# Patient Record
Sex: Female | Born: 1935 | Race: White | Hispanic: No | Marital: Married | Smoking: Never smoker
Health system: Southern US, Community
[De-identification: ages and names within clinical notes are randomized; demographics above are authoritative.]

## PROBLEM LIST (undated history)

## (undated) DIAGNOSIS — R51 Headache: Secondary | ICD-10-CM

## (undated) DIAGNOSIS — J40 Bronchitis, not specified as acute or chronic: Secondary | ICD-10-CM

## (undated) DIAGNOSIS — E039 Hypothyroidism, unspecified: Secondary | ICD-10-CM

## (undated) DIAGNOSIS — S329XXA Fracture of unspecified parts of lumbosacral spine and pelvis, initial encounter for closed fracture: Secondary | ICD-10-CM

## (undated) DIAGNOSIS — I10 Essential (primary) hypertension: Secondary | ICD-10-CM

## (undated) DIAGNOSIS — G8929 Other chronic pain: Secondary | ICD-10-CM

## (undated) DIAGNOSIS — M549 Dorsalgia, unspecified: Secondary | ICD-10-CM

## (undated) DIAGNOSIS — I38 Endocarditis, valve unspecified: Secondary | ICD-10-CM

## (undated) DIAGNOSIS — F32A Depression, unspecified: Secondary | ICD-10-CM

## (undated) DIAGNOSIS — N289 Disorder of kidney and ureter, unspecified: Secondary | ICD-10-CM

## (undated) DIAGNOSIS — I509 Heart failure, unspecified: Secondary | ICD-10-CM

## (undated) DIAGNOSIS — F329 Major depressive disorder, single episode, unspecified: Secondary | ICD-10-CM

## (undated) HISTORY — PX: TONSILLECTOMY: SUR1361

## (undated) HISTORY — PX: ABDOMINAL HYSTERECTOMY: SHX81

## (undated) HISTORY — PX: HERNIA REPAIR: SHX51

---

## 1999-02-11 ENCOUNTER — Inpatient Hospital Stay (HOSPITAL_COMMUNITY): Admission: EM | Admit: 1999-02-11 | Discharge: 1999-02-13 | Payer: Self-pay | Admitting: Emergency Medicine

## 1999-02-11 ENCOUNTER — Encounter: Payer: Self-pay | Admitting: Interventional Cardiology

## 1999-03-30 ENCOUNTER — Encounter: Payer: Self-pay | Admitting: Family Medicine

## 1999-03-30 ENCOUNTER — Encounter: Admission: RE | Admit: 1999-03-30 | Discharge: 1999-03-30 | Payer: Self-pay | Admitting: Family Medicine

## 2000-03-20 ENCOUNTER — Other Ambulatory Visit: Admission: RE | Admit: 2000-03-20 | Discharge: 2000-03-20 | Payer: Self-pay | Admitting: Family Medicine

## 2000-03-31 ENCOUNTER — Encounter: Payer: Self-pay | Admitting: Family Medicine

## 2000-03-31 ENCOUNTER — Encounter: Admission: RE | Admit: 2000-03-31 | Discharge: 2000-03-31 | Payer: Self-pay | Admitting: Family Medicine

## 2000-05-03 ENCOUNTER — Encounter (INDEPENDENT_AMBULATORY_CARE_PROVIDER_SITE_OTHER): Payer: Self-pay | Admitting: Specialist

## 2000-05-03 ENCOUNTER — Ambulatory Visit (HOSPITAL_COMMUNITY): Admission: RE | Admit: 2000-05-03 | Discharge: 2000-05-03 | Payer: Self-pay | Admitting: Gastroenterology

## 2000-06-23 ENCOUNTER — Encounter: Payer: Self-pay | Admitting: Surgery

## 2000-06-23 ENCOUNTER — Ambulatory Visit (HOSPITAL_COMMUNITY): Admission: RE | Admit: 2000-06-23 | Discharge: 2000-06-23 | Payer: Self-pay | Admitting: Surgery

## 2000-07-04 ENCOUNTER — Encounter: Payer: Self-pay | Admitting: Surgery

## 2000-07-11 ENCOUNTER — Encounter: Payer: Self-pay | Admitting: Surgery

## 2000-07-13 ENCOUNTER — Inpatient Hospital Stay (HOSPITAL_COMMUNITY): Admission: EM | Admit: 2000-07-13 | Discharge: 2000-07-14 | Payer: Self-pay | Admitting: Surgery

## 2000-07-13 ENCOUNTER — Encounter: Payer: Self-pay | Admitting: Interventional Cardiology

## 2001-04-03 ENCOUNTER — Encounter: Admission: RE | Admit: 2001-04-03 | Discharge: 2001-04-03 | Payer: Self-pay | Admitting: Family Medicine

## 2001-04-03 ENCOUNTER — Encounter: Payer: Self-pay | Admitting: Family Medicine

## 2001-06-28 ENCOUNTER — Encounter: Admission: RE | Admit: 2001-06-28 | Discharge: 2001-06-28 | Payer: Self-pay | Admitting: Otolaryngology

## 2001-06-28 ENCOUNTER — Encounter: Payer: Self-pay | Admitting: Otolaryngology

## 2002-04-04 ENCOUNTER — Encounter: Payer: Self-pay | Admitting: Family Medicine

## 2002-04-04 ENCOUNTER — Encounter: Admission: RE | Admit: 2002-04-04 | Discharge: 2002-04-04 | Payer: Self-pay | Admitting: Family Medicine

## 2003-04-08 ENCOUNTER — Ambulatory Visit (HOSPITAL_COMMUNITY): Admission: RE | Admit: 2003-04-08 | Discharge: 2003-04-08 | Payer: Self-pay | Admitting: Sports Medicine

## 2003-04-15 ENCOUNTER — Other Ambulatory Visit: Admission: RE | Admit: 2003-04-15 | Discharge: 2003-04-15 | Payer: Self-pay | Admitting: Family Medicine

## 2003-04-23 ENCOUNTER — Encounter: Admission: RE | Admit: 2003-04-23 | Discharge: 2003-04-23 | Payer: Self-pay | Admitting: Family Medicine

## 2003-07-03 ENCOUNTER — Encounter: Admission: RE | Admit: 2003-07-03 | Discharge: 2003-07-03 | Payer: Self-pay | Admitting: Interventional Cardiology

## 2003-07-04 ENCOUNTER — Inpatient Hospital Stay (HOSPITAL_BASED_OUTPATIENT_CLINIC_OR_DEPARTMENT_OTHER): Admission: RE | Admit: 2003-07-04 | Discharge: 2003-07-04 | Payer: Self-pay | Admitting: Interventional Cardiology

## 2004-07-20 ENCOUNTER — Ambulatory Visit (HOSPITAL_COMMUNITY): Admission: RE | Admit: 2004-07-20 | Discharge: 2004-07-20 | Payer: Self-pay | Admitting: Family Medicine

## 2004-07-26 ENCOUNTER — Ambulatory Visit (HOSPITAL_COMMUNITY): Admission: RE | Admit: 2004-07-26 | Discharge: 2004-07-26 | Payer: Self-pay | Admitting: Interventional Cardiology

## 2004-07-26 ENCOUNTER — Encounter: Payer: Self-pay | Admitting: Internal Medicine

## 2004-09-15 ENCOUNTER — Ambulatory Visit: Payer: Self-pay | Admitting: Pulmonary Disease

## 2004-09-21 ENCOUNTER — Ambulatory Visit: Payer: Self-pay | Admitting: Pulmonary Disease

## 2004-09-21 ENCOUNTER — Ambulatory Visit: Admission: RE | Admit: 2004-09-21 | Discharge: 2004-09-21 | Payer: Self-pay | Admitting: Pulmonary Disease

## 2004-10-14 ENCOUNTER — Ambulatory Visit: Payer: Self-pay | Admitting: Pulmonary Disease

## 2005-08-09 ENCOUNTER — Ambulatory Visit (HOSPITAL_COMMUNITY): Admission: RE | Admit: 2005-08-09 | Discharge: 2005-08-09 | Payer: Self-pay | Admitting: Family Medicine

## 2005-12-29 ENCOUNTER — Encounter (INDEPENDENT_AMBULATORY_CARE_PROVIDER_SITE_OTHER): Payer: Self-pay | Admitting: Interventional Cardiology

## 2005-12-29 ENCOUNTER — Inpatient Hospital Stay (HOSPITAL_COMMUNITY): Admission: EM | Admit: 2005-12-29 | Discharge: 2005-12-30 | Payer: Self-pay | Admitting: Emergency Medicine

## 2006-06-08 ENCOUNTER — Other Ambulatory Visit: Admission: RE | Admit: 2006-06-08 | Discharge: 2006-06-08 | Payer: Self-pay | Admitting: Family Medicine

## 2006-07-26 ENCOUNTER — Encounter: Admission: RE | Admit: 2006-07-26 | Discharge: 2006-08-23 | Payer: Self-pay | Admitting: Family Medicine

## 2006-08-11 ENCOUNTER — Ambulatory Visit (HOSPITAL_COMMUNITY): Admission: RE | Admit: 2006-08-11 | Discharge: 2006-08-11 | Payer: Self-pay | Admitting: Family Medicine

## 2007-01-10 ENCOUNTER — Encounter
Admission: RE | Admit: 2007-01-10 | Discharge: 2007-01-10 | Payer: Self-pay | Admitting: Physical Medicine and Rehabilitation

## 2007-08-14 ENCOUNTER — Ambulatory Visit: Payer: Self-pay | Admitting: Internal Medicine

## 2007-08-14 ENCOUNTER — Ambulatory Visit: Payer: Self-pay | Admitting: Pulmonary Disease

## 2007-08-14 DIAGNOSIS — R0602 Shortness of breath: Secondary | ICD-10-CM

## 2007-08-20 ENCOUNTER — Telehealth (INDEPENDENT_AMBULATORY_CARE_PROVIDER_SITE_OTHER): Payer: Self-pay | Admitting: *Deleted

## 2007-08-30 ENCOUNTER — Telehealth (INDEPENDENT_AMBULATORY_CARE_PROVIDER_SITE_OTHER): Payer: Self-pay | Admitting: *Deleted

## 2007-09-08 ENCOUNTER — Encounter: Payer: Self-pay | Admitting: Pulmonary Disease

## 2007-09-08 DIAGNOSIS — J309 Allergic rhinitis, unspecified: Secondary | ICD-10-CM | POA: Insufficient documentation

## 2007-09-08 DIAGNOSIS — Z8679 Personal history of other diseases of the circulatory system: Secondary | ICD-10-CM | POA: Insufficient documentation

## 2007-09-08 DIAGNOSIS — I359 Nonrheumatic aortic valve disorder, unspecified: Secondary | ICD-10-CM

## 2007-09-08 DIAGNOSIS — R51 Headache: Secondary | ICD-10-CM | POA: Insufficient documentation

## 2007-09-08 DIAGNOSIS — H9190 Unspecified hearing loss, unspecified ear: Secondary | ICD-10-CM | POA: Insufficient documentation

## 2007-09-08 DIAGNOSIS — I10 Essential (primary) hypertension: Secondary | ICD-10-CM

## 2007-09-08 DIAGNOSIS — H3530 Unspecified macular degeneration: Secondary | ICD-10-CM | POA: Insufficient documentation

## 2007-09-08 DIAGNOSIS — R519 Headache, unspecified: Secondary | ICD-10-CM | POA: Insufficient documentation

## 2007-09-13 ENCOUNTER — Ambulatory Visit: Payer: Self-pay | Admitting: Internal Medicine

## 2007-09-13 DIAGNOSIS — J40 Bronchitis, not specified as acute or chronic: Secondary | ICD-10-CM

## 2007-10-17 ENCOUNTER — Ambulatory Visit (HOSPITAL_COMMUNITY): Admission: RE | Admit: 2007-10-17 | Discharge: 2007-10-17 | Payer: Self-pay | Admitting: Family Medicine

## 2007-11-27 ENCOUNTER — Ambulatory Visit (HOSPITAL_COMMUNITY): Admission: RE | Admit: 2007-11-27 | Discharge: 2007-11-27 | Payer: Self-pay | Admitting: Family Medicine

## 2007-12-06 ENCOUNTER — Ambulatory Visit: Payer: Self-pay | Admitting: Pulmonary Disease

## 2007-12-20 ENCOUNTER — Ambulatory Visit: Payer: Self-pay | Admitting: Internal Medicine

## 2008-01-07 ENCOUNTER — Ambulatory Visit: Payer: Self-pay | Admitting: Internal Medicine

## 2009-01-13 ENCOUNTER — Ambulatory Visit (HOSPITAL_COMMUNITY): Admission: RE | Admit: 2009-01-13 | Discharge: 2009-01-13 | Payer: Self-pay | Admitting: Family Medicine

## 2009-07-17 ENCOUNTER — Emergency Department (HOSPITAL_COMMUNITY): Admission: EM | Admit: 2009-07-17 | Discharge: 2009-07-17 | Payer: Self-pay | Admitting: Emergency Medicine

## 2009-08-07 ENCOUNTER — Ambulatory Visit (HOSPITAL_COMMUNITY): Admission: RE | Admit: 2009-08-07 | Discharge: 2009-08-07 | Payer: Self-pay | Admitting: Family Medicine

## 2010-04-20 ENCOUNTER — Emergency Department (HOSPITAL_COMMUNITY)
Admission: EM | Admit: 2010-04-20 | Discharge: 2010-04-20 | Payer: Self-pay | Source: Home / Self Care | Admitting: Emergency Medicine

## 2010-06-07 ENCOUNTER — Encounter: Payer: Self-pay | Admitting: Family Medicine

## 2010-07-26 LAB — POCT CARDIAC MARKERS
CKMB, poc: 1 ng/mL (ref 1.0–8.0)
CKMB, poc: <1 ng/mL — ABNORMAL LOW (ref 1.0–8.0)
Myoglobin, poc: 132 ng/mL (ref 12–200)
Myoglobin, poc: 161 ng/mL (ref 12–200)
Troponin i, poc: <0.05 ng/mL (ref 0.00–0.09)
Troponin i, poc: <0.05 ng/mL (ref 0.00–0.09)

## 2010-07-26 LAB — URINE MICROSCOPIC-ADD ON

## 2010-07-26 LAB — DIFFERENTIAL
Basophils Absolute: 0 10*3/uL (ref 0.0–0.1)
Basophils Relative: 0 % (ref 0–1)
Eosinophils Absolute: 0.1 K/uL (ref 0.0–0.7)
Eosinophils Relative: 2 % (ref 0–5)
Lymphocytes Relative: 22 % (ref 12–46)
Lymphs Abs: 1.5 K/uL (ref 0.7–4.0)
Monocytes Absolute: 0.3 10*3/uL (ref 0.1–1.0)
Monocytes Relative: 4 % (ref 3–12)
Neutro Abs: 5 10*3/uL (ref 1.7–7.7)
Neutrophils Relative %: 72 % (ref 43–77)

## 2010-07-26 LAB — URINALYSIS, ROUTINE W REFLEX MICROSCOPIC
Bilirubin Urine: NEGATIVE
Glucose, UA: NEGATIVE mg/dL
Hgb urine dipstick: NEGATIVE
Ketones, ur: NEGATIVE mg/dL
Nitrite: NEGATIVE
Protein, ur: NEGATIVE mg/dL
Specific Gravity, Urine: 1.012 (ref 1.005–1.030)
Urobilinogen, UA: 0.2 mg/dL (ref 0.0–1.0)
pH: 7 (ref 5.0–8.0)

## 2010-07-26 LAB — CBC
HCT: 34.3 % — ABNORMAL LOW (ref 36.0–46.0)
Hemoglobin: 11.1 g/dL — ABNORMAL LOW (ref 12.0–15.0)
MCH: 28.5 pg (ref 26.0–34.0)
MCHC: 32.4 g/dL (ref 30.0–36.0)
MCV: 88.2 fL (ref 78.0–100.0)
Platelets: 270 10*3/uL (ref 150–400)
RBC: 3.89 MIL/uL (ref 3.87–5.11)
RDW: 12.8 % (ref 11.5–15.5)
WBC: 6.9 K/uL (ref 4.0–10.5)

## 2010-07-26 LAB — URINE CULTURE
Colony Count: NO GROWTH
Culture  Setup Time: 201112061250
Culture: NO GROWTH

## 2010-07-26 LAB — POCT I-STAT, CHEM 8
BUN: 31 mg/dL — ABNORMAL HIGH (ref 6–23)
Calcium, Ion: 1.09 mmol/L — ABNORMAL LOW (ref 1.12–1.32)
Chloride: 103 mEq/L (ref 96–112)
Creatinine, Ser: 1.6 mg/dL — ABNORMAL HIGH (ref 0.4–1.2)
Glucose, Bld: 125 mg/dL — ABNORMAL HIGH (ref 70–99)
HCT: 34 % — ABNORMAL LOW (ref 36.0–46.0)
Hemoglobin: 11.6 g/dL — ABNORMAL LOW (ref 12.0–15.0)
Potassium: 4 mEq/L (ref 3.5–5.1)
Sodium: 137 meq/L (ref 135–145)
TCO2: 29 mmol/L (ref 0–100)

## 2010-07-26 LAB — BRAIN NATRIURETIC PEPTIDE: Pro B Natriuretic peptide (BNP): 154 pg/mL — ABNORMAL HIGH (ref 0.0–100.0)

## 2010-07-26 LAB — HEPATIC FUNCTION PANEL
ALT: 27 U/L (ref 0–35)
AST: 32 U/L (ref 0–37)
Albumin: 3.5 g/dL (ref 3.5–5.2)
Alkaline Phosphatase: 116 U/L (ref 39–117)
Bilirubin, Direct: 0.1 mg/dL (ref 0.0–0.3)
Indirect Bilirubin: 0.7 mg/dL (ref 0.3–0.9)
Total Bilirubin: 0.8 mg/dL (ref 0.3–1.2)
Total Protein: 6.9 g/dL (ref 6.0–8.3)

## 2010-07-26 LAB — LIPASE, BLOOD: Lipase: 19 U/L (ref 11–59)

## 2010-08-06 LAB — BASIC METABOLIC PANEL
CO2: 28 mEq/L (ref 19–32)
Creatinine, Ser: 1.43 mg/dL — ABNORMAL HIGH (ref 0.4–1.2)
GFR calc non Af Amer: 35 mL/min — ABNORMAL LOW (ref >60)
Glucose, Bld: 139 mg/dL — ABNORMAL HIGH (ref 70–99)
Sodium: 141 mEq/L (ref 135–145)

## 2010-08-06 LAB — DIFFERENTIAL
Lymphocytes Relative: 11 % — ABNORMAL LOW (ref 12–46)
Lymphs Abs: 1.1 10*3/uL (ref 0.7–4.0)
Monocytes Relative: 5 % (ref 3–12)
Neutrophils Relative %: 80 % — ABNORMAL HIGH (ref 43–77)

## 2010-08-06 LAB — CBC
MCV: 89.1 fL (ref 78.0–100.0)
Platelets: 267 10*3/uL (ref 150–400)
RBC: 3.36 MIL/uL — ABNORMAL LOW (ref 3.87–5.11)
WBC: 10 10*3/uL (ref 4.0–10.5)

## 2010-08-06 LAB — POCT CARDIAC MARKERS: Myoglobin, poc: 121 ng/mL (ref 12–200)

## 2010-08-21 LAB — CREATININE, SERUM
Creatinine, Ser: 1.53 mg/dL — ABNORMAL HIGH (ref 0.4–1.2)
GFR calc Af Amer: 40 mL/min — ABNORMAL LOW (ref >60)
GFR calc non Af Amer: 33 mL/min — ABNORMAL LOW (ref >60)

## 2010-10-01 NOTE — Cardiovascular Report (Signed)
NAME:  Julie Mclean, Julie Mclean                            ACCOUNT NO.:  [id number]   MEDICAL RECORD NO.:  [id number]                   PATIENT TYPE:  OIB   LOCATION:  2899                                 FACILITY:  [facility]   PHYSICIAN:  Lesleigh Noe, M.D.            DATE OF BIRTH:  1928/02/03   DATE OF PROCEDURE:  07/04/2003  DATE OF DISCHARGE:                              CARDIAC CATHETERIZATION   REASON FOR EVALUATION:  Recent Cardiolite study demonstrating inferoapical  ischemia.  The study is being done to rule out development of coronary  disease since last cardiac evaluation.  The patient's major complaint has  been exertional dyspnea.   PROCEDURE PERFORMED:  1. Left heart cath.  2. Selective coronary angio.  3. Left ventriculography.   DESCRIPTION:  After informed consent, a 4-French sheath was placed in the  right femoral artery using a modified Seldinger technique.  A 4-French JR-4  diagnostic catheter was used for hemodynamic recordings in the aorta and  left ventricle and also diagnostic right coronary angiography; 200 mcg of  intracoronary nitroglycerin was administered into the right coronary with  this catheter.  We then used a #4 [place] left Judkins' catheter for left  coronary angiography.  The patient tolerated the procedure without  complications.   RESULTS:  1. HEMODYNAMIC DATA:     A. Aortic pressure 139/50.     B. Left ventricular pressure 151/6 mmHg.  2. LEFT VENTRICULOGRAPHY:  Left ventricular cavity is normal in size and     demonstrates normal overall contractility.  3. CORONARY ANGIOGRAPHY:     A. Left main coronary normal.     B. Left anterior descending coronary reveals a large vessel, gives origin        to a large diagonal that arises proximally and also another diagonal        that arises from the mid portion of the vessel.  The LAD system is        normal.     C. Circumflex artery.  Two obtuse marginal branches arise from it.  A        second  marginal is the largest and dominant vessel that bifurcates on        the inferolateral wall.  The circumflex system is also normal.     D. Right coronary.  The right coronary artery is relatively small, gives        PDA and has no significant obstruction.   CONCLUSIONS:  1. Essentially normal coronary arteries.  2. Normal LV function.  3. False-positive Cardiolite study.                                               Lesleigh Noe, M.D.    HWS/MEDQ  D:  07/04/2003  T:  07/04/2003  Job:  [id number]   cc:   Chales Salmon. Abigail Miyamoto, M.D.  [address]  [place]  [place] [address]  Fax: [phone]

## 2010-10-01 NOTE — Op Note (Signed)
[facility]  Patient:    Julie Mclean, Julie Mclean                    MRN: [id number] Proc. Date: 07/11/00 Adm. Date:  [id number] Attending:  Katha Cabal CC:         Illa Level, M.D.  Florencia Reasons, M.D.   Operative Report  PREOPERATIVE DIAGNOSES:  Large hiatal hernia with partial intrathoracic stomach.  POSTOPERATIVE DIAGNOSES:  Large hiatal hernia with partial intrathoracic stomach.  PROCEDURE:  Laparoscopic takedown and repair of hiatal hernia and Nissen fundoplication.  SURGEON:  Dr. Daphine Deutscher.  ASSISTANT:  Dr. Gerrit Friends.  DESCRIPTION OF PROCEDURE:  Ms. Dorner was taken to room 11 on February 26 and given general anesthesia. The abdomen was prepped widely with Betadine and draped sterilely. I made a supraumbilical longitudinal incision and entered the abdomen through a pursestring of #0 Vicryl. The abdomen was insufflated and then an angled scope 30 degree was inserted into the abdomen. Two 10/11s were placed in the right upper quadrant, a 5 mm in the upper midline for the liver retractor, and another 10/11 in the left upper quadrant placed. The liver was retracted cephalad and the hiatus was exposed. A large portion of the stomach was seen sucked up into the chest. The stomach was grasped and reduced and it did come down. I then incised the gastrohepatic ligament. I did preserve a large branch going to the liver and went above that. The wrap eventually went above that vessel. I dissected the right crus first and then incised the sac and brought a lot of the sac down out of the chest and brought that down onto the esophagus and stomach. I dissected the left crus and then went up on the left side and took down the short gastrics and this facilitated the complete dissection of he left crus and removal of more of the sac. In doing this, I identified and preserved the anterior vagus nerve and then preserved the posterior vagus nerve as well.  I kept the vagus nerve anterior and went beneath it and enclosed the hiatus first using a figure-of-eight suture using the endostitch. I pulled this on through and completed the figure-of-eight suture and it did tend to lock and there was a little resistance pulling it through so therefore I did an intracorporeal tie and that began the first closure of the hiatus. I then placed two subsequent simple sutures posterior to the esophagus and this seemed to snug up the esophagus pretty completely. With that, I then reached around and grasped the cardia and brought it through easily and then we passed a 50 lighted bougie into the stomach. This was done and passed nicely under lumination. With it in place seeing that the crura were adequately closed, the wrap was then performed using three sutures using the endostitch with the top one being tied extracorporeally and the bottom two were intracorporeal ties. After the wrap was completed, the bougie was removed. The right crus was tacked to the wrapped portion of stomach with a simple stitch which was tied down to anchor the wrap on the diaphragm. There was essentially no bleeding noted. Everything seemed to look good. I surveyed the area that I had been operating in and no other injuries or bleeders were noted. The patient seemed to tolerate the procedure well. The pursestring was tied down in the supraumbilical port. The other ports were inspected, no active bleeding was seen. The abdomen was deflated and  these were remove. The port sites were injected with 0.5% Marcaine. Also I would indicate that I did use a Penrose drain when I dissected the esophagogastric junction and used that as I was closing the hiatus. This Penrose drain was removed prior to the completion of the case. The skin was closed with 4-0 Vicryl, Benzoin and Steri-Strips. The patient tolerated the procedure well and was taken to the recovery room in satisfactory condition. DD:   07/11/00 TD:  07/12/00 Job: [id number]

## 2010-10-01 NOTE — Consult Note (Signed)
[facility]  Patient:    Julie Mclean, Julie Mclean                    MRN: [id number] Proc. Date: 07/11/00 Adm. Date:  [id number] Attending:  Katha Cabal CC:         Chales Salmon. Abigail Miyamoto, M.D.  Florencia Reasons, M.D.  Celso Sickle, M.D.   Consultation Report  CHIEF COMPLAINT:  Congestive heart failure.  HISTORY OF PRESENT ILLNESS:  This is a 77 year old white female with a history of sclerotic aortic valve with mild to moderate aortic regurgitation and history of dyspnea, status post cardiac catheterization in 2000 with normal coronary arteries by report.  A 2-D echocardiogram done in 1999 showed mild to moderate aortic regurgitation with aortic valve sclerosis.  The aortic valve area was 1.8 sq cm.  Repeat echocardiogram in November 2001 showed moderate aortic valve sclerosis with mild aortic stenosis.  Aortic valve area was 1.8 sq cm.  Repeat echocardiogram in November 2001 showed moderate aortic valve sclerosis with mild aortic stenosis.  Aortic valve area was 1.4 sq cm with mild to moderate aortic regurgitation.  There was also noted to be increased left ventricular end-diastolic dimensions at 5.9 cm.  She also has had problems with hypertension and was placed on Prinzide.  She was found to have a large hiatal hernia and presented today for a laparoscopic repair of her hernia.  Her surgery went without problems.  She was under anesthesia for three hours in Trendelenburg and received 2700 cc of IV fluids with only 300 cc out.  In recovery, her oxygen saturation dropped to 83%, when she was placed on 100% face mask with increase in her saturations to 95%.  Chest x-ray showed congestive heart failure.  She received Lasix 20 mg IV with 450 cc output.  Currently, she is on 50% O2 with O2 saturations of 90%.  She is not short of breath and denies any chest pain or heaviness.  PAST MEDICAL HISTORY: 1. Aortic valve disease, as stated  above. 2. Depression. 3. Hypothyroidism. 4. Hypertension. 5. Osteoporosis. 6. Macular degeneration. 7. Decreased hearing bilaterally. 8. Status post bilateral salpingo-oophorectomy and hysterectomy.  ALLERGIES:  SULFA.  SOCIAL HISTORY:  She does not smoke or drink.  FAMILY HISTORY:  Her father died of lung cancer.  She is not sure why her mother died.  She has no siblings.  MEDICATIONS: 1. Prozac. 2. Ogen. 3. Tamoxifen. 4. Tylenol. 5. Synthroid. 6. Prinzide 10/12.5 mg 1 per day.  REVIEW OF SYSTEMS:  Negative except for a hiatal hernia and what is stated in the HPI.  PHYSICAL EXAMINATION:  VITAL SIGNS:  Blood pressure 132/64 with a heart rate of 100.  GENERAL:  This is a well-developed, obese white female in no acute distress.  NECK:  Supple with no lymphadenopathy, no bruits, no JVD.  LUNGS:  Rales approximately one-half way up bilaterally posteriorly with decreased breath sounds at the bases.  HEART:  Regular rate and rhythm.  No audible murmurs.  ABDOMEN:  Obese, mildly tender.  EXTREMITIES:  No edema.  LABORATORY DATA:  EKG is pending.  Chest x-ray shows congestive heart failure.  ASSESSMENT: 1. Status post laparoscopic repair of a hiatal hernia. 2. Volume overload with congestive heart failure. 3. Mild aortic stenosis by echocardiogram with aortic valve reportedly    1.4 sq cm in November 2002. 4. Mildly dilated left ventricle cavity. 5. Hypertension, which is stable.  PLAN:  IV Lasix 60  mg now, change oxygen to 6 L nasal cannula, and decrease as O2 saturations remain greater than or equal to 92%.  IV Lasix 40 mg q.d. until adequate diuresis has been obtained.  Restart Prinzide 10/12.5 mg q.d. DD:  07/11/00 TD:  07/12/00 Job: [id number]

## 2010-10-01 NOTE — Op Note (Signed)
Julie Mclean, ROUTE                  ACCOUNT NO.:  [id number]   MEDICAL RECORD NO.:  [id number]          PATIENT TYPE:  OUT   LOCATION:  DFTL                         FACILITY:  [facility]   PHYSICIAN:  Oley Balm. Sung Amabile, M.D. [facility] OF BIRTH:  03-31-1928   DATE OF PROCEDURE:  09/21/2004  DATE OF DISCHARGE:  09/21/2004                                 OPERATIVE REPORT   INDICATIONS FOR TESTING:  Exertional dyspnea.   PROCEDURE:  Cardiopulmonary stress testing was performed on a graded  treadmill.  Testing was stopped due to perceived dyspnea and fatigue.  Effort was suboptimal.  At peak exercise, oxygen uptake was 12.8 ml/kg/min,  or 62% of predicted maximum, indicating mild-to-moderate exercise  impairment.  Peak work load was 3.5 mets, consistent with severe impairment.  The discrepancy between work load and oxygen uptake suggests a component of  work inefficiency.   At peak exercise, heart rate was 102 beats per minute, or 70% of predicted  maximum, indicating a cardiovascular reserve remained.  Oxygen pulse was  normal, suggesting normal stroke volume.  Blood pressure response was  normal.  EKG tracings revealed no arrhythmias and no ischemia.   At peak exercise, minute ventilation was 31.3 liters per minute, or 62% of  predicted maximum (based on her FEV1, she was unable to perform the maximum  voluntary ventilation maneuver).  This indicates that ventilatory reserve  remained.  Gas exchange parameters revealed no abnormalities.  Baseline  spirometry revealed a mixed obstruction/restriction pattern with a mild-to-  moderate reduction in FEV1.  Post exercise spirometry was not performed.   SUMMARY:  Moderate-to-severe exercise impairment due to suboptimal effort  and work inefficiency.  No cardiovascular pathology was identified.  A mixed  obstructive/restrictive pattern is noted on spirometry, but she was not  limited by her ventilatory capacity.  There were no notations regarding  her  exact symptoms at the time of termination of the testing.  Does this woman  suffered from a musculoskeletal disorder?  Alternative explantations could  be severe deconditioning.  Due to the fact that this is a suboptimal test,  occult cardiovascular disease cannot be fully ruled out.      DBS/MEDQ  D:  10/12/2004  T:  10/12/2004  Job:  [id number]   cc:   Marcelyn Bruins, M.D. [facility]

## 2010-10-01 NOTE — H&P (Signed)
Julie Mclean, Julie Mclean                  ACCOUNT NO.:  [id number]   MEDICAL RECORD NO.:  [id number]          PATIENT TYPE:  INP   LOCATION:  3715                         FACILITY:  [facility]   PHYSICIAN:  Kela Millin, M.D.DATE OF BIRTH:  Jul 04, 1927   DATE OF ADMISSION:  12/29/2005  DATE OF DISCHARGE:  12/30/2005                                HISTORY & PHYSICAL   PRIMARY CARE PHYSICIAN:  Chales Salmon. Abigail Miyamoto, M.D.   CHIEF COMPLAINT:  Chest pain and chills.   HISTORY OF PRESENT ILLNESS:  The patient is a 77 year old white female with  a past medical history significant for aortic valve disease, history of CHF,  mitral valve prolapse, and history of large hiatal hernia/GERD and an  intrathoracic stomach, status post laparoscopic repair, who presents with  above complaints.  Ms. Courts states that she was in her usual state of  health until the night prior to admission when she began having chest  discomfort, described as pressure, 5/10 in intensity with no radiation.  She  states that when she woke up the next day, she had chills and also became  diaphoretic and had two episodes of nausea and vomiting.  She admits to  associated shortness of breath.  She states that she has had longstanding  exertional dyspnea.  The patient also states that she has had a  nonproductive cough and dysuria for the past couple of days.  She denies  diarrhea, hematemesis, melena, and no hematochezia.   The patient was seen in the ER.  Her EKG showed normal sinus rhythm with  first-degree AV block.  She also had a B-type natriuretic peptide done which  was within normal limits at 42.  Her urinalysis was consistent with a UTI,  and chest x-ray was negative for acute cardiopulmonary disease.  A D-dimer  was done, and it was slightly elevated at 0.086.  Her point-of-care markers  were negative.   [facility] was consulted, and patient admitted to the [facility]  hospitalist service for further evaluation and  management.   It was noted that the patient had a cardiac catheterization in February,  2005, per Dr. Verdis Prime, and it revealed normal coronaries.   PAST MEDICAL HISTORY:  As stated above.   MEDICATIONS:  1. Synthroid 25 mcg daily.  2. Atacand 32.5 mg.  3. Prozac 40 mg.  4. Restoril p.r.n.   ALLERGIES:  SULFA.   SOCIAL HISTORY:  She states that she quit tobacco in the 1970s.  She admits  to occasional alcohol.   FAMILY HISTORY:  Her father is deceased.  He had lung cancer.  Her mother is  also deceased, had a stroke.   REVIEW OF SYSTEMS:  As per HPI, otherwise review of systems negative.   PHYSICAL EXAMINATION:  VITAL SIGNS:  Temperature 98.4, blood pressure  128/48, pulse 96, respiratory rate 14, O2 sat 95%.  GENERAL:  The patient is an elderly white female, alert and oriented, in no  respiratory distress.  HEENT:  PERRL.  EOMI.  Sclerae are anicteric.  Slightly dry mucous  membranes.  No oral  exudates.  NECK:  Supple.  No adenopathy.  No thyromegaly.  No JVD.  LUNGS:  Decreased breath sounds at the bases, otherwise no wheezes and no  crackles.  CARDIOVASCULAR:  A regular rate and rhythm.  A 2/6 systolic murmur.  No  gallop.  ABDOMEN:  Soft.  Bowel sounds present.  Nontender, nondistended.  No  organomegaly.  No masses palpable.  EXTREMITIES:  No cyanosis or edema.  NEUROLOGIC:  Alert and oriented x3.  Cranial nerves II-XII grossly intact.  Nonfocal exam.   LABORATORY DATA:  White cell count 11.2, hemoglobin 12, hematocrit 34.7,  platelet count 258, neutrophil count 91%.  Point-of-care markers are  negative.  Sodium is 139, potassium 4.2, chloride 104, CO2 24, glucose 112,  BUN 27, creatinine 1.2, calcium 9.6.  AST is 280, ALT 149, alkaline  phosphatase 121, total bilirubin 1.  B-type natriuretic peptide 42.  Urinalysis:  Hazy, specific gravity 1.020, urine nitrite negative, leukocyte  esterase moderate, urine WBCs 21-50, many bacteria.  Her D-dimer is 0.86.    Chest x-ray shows borderline cardiomegaly but no acute cardiopulmonary  disease, interstitial/peribronchial thickening involving the lung bases but  similar on the prior exam, felt to be chronic in nature.   CT angiogram, no pulmonary emboli.  Chronic changes with no acute process.  Complex lesion of the right lobe of the thyroid with rim calcification.  Scattered lymph nodes, not histologically enlarged.   ASSESSMENT/PLAN:  1. Urinary tract infection:  Obtain urine cultures, empiric antibiotics,      and follow.  2. Chest pain:  The patient also with fevers and history of aortic valve      disease.  As noted, she had a cardiac catheterization in 2005 with      normal coronaries.  We will obtain serial cardiac enzymes and EKG.      Obtain blood cultures and 2D echo to evaluate for      vegetations/endocarditis.  3. Complex right thyroid lesion:  Check thyroid function tests.  Also      obtain a thyroid ultrasound.  Follow and consider further evaluation as      appropriate pending above results.  4. Azotemia:  Likely secondary to volume depletion.  Hydrate and follow.  5. Aortic valve disease, history of seizure:  Continue ACE inhibitor.      Obtain 2D echo, as above.  Manage her fluids.  6. Gastroesophageal reflux disease:  Place patient on PPI.  7. Hypothyroidism:  Obtain TSH and T3, T4.  Follow.  Continue Synthroid.  8. Abnormal liver function tests:  Follow, repeat, and consider further      evaluation as appropriate.      Kela Millin, M.D.  Electronically Signed     ACV/MEDQ  D:  12/30/2005  T:  12/30/2005  Job:  [id number]   cc:   Chales Salmon. Abigail Miyamoto, M.D.  Lyn Records, M.D.

## 2010-10-01 NOTE — Discharge Summary (Signed)
[facility]  Patient:    Julie Mclean, Julie Mclean                    MRN: [id number] Adm. Date:  [id number] Disc. Date: [id number] Attending:  Katha Cabal CC:         Celso Sickle, M.D.  Chales Salmon. Abigail Miyamoto, M.D.  Florencia Reasons, M.D.   Discharge Summary  ADMISSION DIAGNOSES: 1. Large hiatal hernia with intrathoracic stomach. 2. History of reflux.  PROCEDURES:  Laparoscopic repair of hiatal hernia and laparoscopic Nissen fundoplication on July 11, 2000.  COURSE IN THE HOSPITAL:  [name] is a 77 year old lady followed by Celso Sickle, M.D., for underlying cardiovascular disease.  She underwent a complete work-up for this intrathoracic stomach and a history of reflux by Florencia Reasons, M.D.  She was taken to the operating room on July 11, 2000, and underwent a laparoscopic repair of her hiatal hernia with reduction of a large intrathoracic stomach and also with Nissen fundoplication. Postoperatively, she did have some congestive heart failure noted in the recovery room, which placed her up on the telemetry unit with some nasal oxygen.  She responded to diuretics and was followed by cardiology.  She progressed and was begun on liquids.  She advanced to full liquids and was ready for discharge on July 14, 2000.  She was given a prescription for Mepergan Fortis to take for pain.  Her incisions were bland.  She was instructed to stay on a soft diet and gradually advance to a regular diet.  We will see her back in the office in three weeks.  CONDITION ON DISCHARGE:  Good. DD:  07/14/00 TD:  07/16/00 Job: [id number]

## 2010-10-01 NOTE — Procedures (Signed)
[place]  Patient:    Julie Mclean, Julie Mclean                         MRN: [id number] Proc. Date: 05/03/00 Adm. Date:  [id number] Attending:  Rich Brave CC:         Chales Salmon. Abigail Miyamoto, M.D.   Procedure Report  PROCEDURE PERFORMED:  Colonoscopy.  ENDOSCOPIST:  Florencia Reasons, M.D.  INDICATIONS FOR PROCEDURE:  The patient is a 77 year old female with a recurrently hemoccult negative stool but progressive microcytic anemia.  FINDINGS:  Normal exam to the cecum.  DESCRIPTION OF PROCEDURE:  The nature, purpose and risks of the procedure had been discussed with the patient, who provided written consent.  Sedation for this procedure and the upper endoscopy which preceeded it totalled fentanyl 75 mcg, Versed 8.5 mg and also Glucophage 0.5 mg IV administered during the upper endoscopy to reduce duodenal contractility.  The Olympus adult video colonoscope was advanced quite easily to the cecum, turning the patient into the supine position and applying some external abdominal compression to traverse the last few inches of the ascending colon into the base of the cecum which was identified by clear visualization of the ileocecal valve.  Pullback was then performed.  We slid past the proximal ascending colon fairly quickly, so I did not get good control by looking down from above (it was difficult to readvance due to patients discomfort) I feel that an adequate look was obtained and that no significant lesions would have been missed. The quality of the prep was verygood and it is felt that all areas were adequately seen.  No polyps, cancer, colitis, vascular malformations or diverticular disease were observed and retroflexion in the rectum was normal.  No biopsies were obtained during this exam.  The patient tolerated the procedure well and there were no apparent complications.  IMPRESSION:  Normal colonoscopy.  PLAN: The patient has shown no  evidence of forming polyps by age 60. She is thus at quite low risk for ever developing colorectal neoplasia in her anticipated lifetime.  Consideration could be given to a flexible sigmoidoscopy five years from now and a repeat colonoscopy 10 years from now if she remains in good general health, at the discretion of her primary physician. DD:  05/03/00 TD:  05/03/00 Job: [id number]

## 2010-10-01 NOTE — Op Note (Signed)
[place]  Patient:    Julie Mclean, Julie Mclean                         MRN: [id number] Proc. Date: 05/03/00 Adm. Date:  [id number] Attending:  Rich Brave CC:         Chales Salmon. Abigail Miyamoto, M.D.   Operative Report  PROCEDURE PERFORMED:  Upper endoscopy with biopsies.  ENDOSCOPIST:  Florencia Reasons, M.D.  INDICATIONS FOR PROCEDURE:  The patient is a 77 year old female with microcytic anemia and hemoccult negative stool on multiple occasions.  FINDINGS:  Large (9 cm) hiatal hernia.  DESCRIPTION OF PROCEDURE:  The nature, purpose and risks of the procedure had been discussed with the patient, who provided written consent.  Sedation for this procedure and the colonoscopy which followed it totaled fentanyl 75 mcg and Versed 8.5 mg IV without arrhythmias or desaturation, and in fact, resulting in just rather mild sedation, probably because the patient is on Prozac and Restoril as an outpatient.  She also uses OxyContin on a regular basis.  The Olympus XQ140 small caliber adult video endoscope was passed easily under direct vision.  The vocal cords and larynx looked normal.  The esophagus was entered easily and had normal mucosa without evidence of Barretts esophagus, reflux esophagitis, varices, infection or neoplasia.  The patient had a very large hiatal hernia.  The lower esophageal sphincter was at 31 to 32 cm from the mouth, and the diaphragmatic hiatal hernia was at 40 cm.  The hiatal hernia measured about 9 cm in longitudinal dimension but was quite broad, probable about 20 cm across, with an estimate that approximately one quarter of the patients stomach was up above the diaphragm. The abdominal portion of the stomach was entered.  The mucosa was normal, without evidence of gastritis, ulcers, erosions, polyps or masses.  Retroflex view showed a very patulous diaphragmatic hiatal hernia without any evidence of erosive changes at the  diaphragmatic hiatus (Camerol lesions).  The pylorus and duodenal bulb looked normal.  Transiently, it appeared that there was a strand or two of blood emanating from between a couple of folds in the second or third portion of the duodenum but after observing this area longer, I did not see any persistent bleeding or any underlying  vascular ectasia or other abnormalities.  Multiple duodenal biopsies were obtained of normal-appearing duodenal mucosa to help definitively exclude celiac disease and the scope was then removed from the patient who tolerated the procedure well and without apparant complication.  IMPRESSION: 1. Very large hiatal hernia, which could readily account for the patients    anemia. 2. The patient has a history of nausea but no obvious source of it was    identified on this exam, unless the hiatal hernia is the culprit.  PLAN:  Proceed to colonoscopic evaluation. DD:  05/03/00 TD:  05/03/00 Job: [id number]

## 2011-06-01 ENCOUNTER — Emergency Department (HOSPITAL_COMMUNITY): Payer: Medicare Other

## 2011-06-01 ENCOUNTER — Inpatient Hospital Stay (HOSPITAL_COMMUNITY)
Admission: EM | Admit: 2011-06-01 | Discharge: 2011-06-08 | DRG: 392 | Disposition: A | Discharge status: Home / Self Care | Payer: Medicare Other | Attending: Internal Medicine | Admitting: Internal Medicine

## 2011-06-01 ENCOUNTER — Encounter (HOSPITAL_COMMUNITY): Payer: Self-pay

## 2011-06-01 ENCOUNTER — Other Ambulatory Visit: Payer: Self-pay

## 2011-06-01 DIAGNOSIS — I359 Nonrheumatic aortic valve disorder, unspecified: Secondary | ICD-10-CM | POA: Diagnosis present

## 2011-06-01 DIAGNOSIS — G8929 Other chronic pain: Secondary | ICD-10-CM | POA: Diagnosis present

## 2011-06-01 DIAGNOSIS — E860 Dehydration: Secondary | ICD-10-CM | POA: Diagnosis present

## 2011-06-01 DIAGNOSIS — E039 Hypothyroidism, unspecified: Secondary | ICD-10-CM | POA: Diagnosis present

## 2011-06-01 DIAGNOSIS — N183 Chronic kidney disease, stage 3 unspecified: Secondary | ICD-10-CM | POA: Diagnosis present

## 2011-06-01 DIAGNOSIS — K5732 Diverticulitis of large intestine without perforation or abscess without bleeding: Principal | ICD-10-CM | POA: Diagnosis present

## 2011-06-01 DIAGNOSIS — Z7982 Long term (current) use of aspirin: Secondary | ICD-10-CM

## 2011-06-01 DIAGNOSIS — K5792 Diverticulitis of intestine, part unspecified, without perforation or abscess without bleeding: Secondary | ICD-10-CM | POA: Diagnosis present

## 2011-06-01 DIAGNOSIS — I509 Heart failure, unspecified: Secondary | ICD-10-CM | POA: Diagnosis present

## 2011-06-01 DIAGNOSIS — M549 Dorsalgia, unspecified: Secondary | ICD-10-CM | POA: Diagnosis present

## 2011-06-01 DIAGNOSIS — I129 Hypertensive chronic kidney disease with stage 1 through stage 4 chronic kidney disease, or unspecified chronic kidney disease: Secondary | ICD-10-CM | POA: Diagnosis present

## 2011-06-01 DIAGNOSIS — F3289 Other specified depressive episodes: Secondary | ICD-10-CM | POA: Diagnosis present

## 2011-06-01 DIAGNOSIS — R7402 Elevation of levels of lactic acid dehydrogenase (LDH): Secondary | ICD-10-CM | POA: Diagnosis not present

## 2011-06-01 DIAGNOSIS — E876 Hypokalemia: Secondary | ICD-10-CM | POA: Diagnosis not present

## 2011-06-01 DIAGNOSIS — I5032 Chronic diastolic (congestive) heart failure: Secondary | ICD-10-CM | POA: Diagnosis present

## 2011-06-01 DIAGNOSIS — R7401 Elevation of levels of liver transaminase levels: Secondary | ICD-10-CM | POA: Diagnosis present

## 2011-06-01 DIAGNOSIS — K5900 Constipation, unspecified: Secondary | ICD-10-CM | POA: Diagnosis present

## 2011-06-01 DIAGNOSIS — Z79899 Other long term (current) drug therapy: Secondary | ICD-10-CM

## 2011-06-01 DIAGNOSIS — N179 Acute kidney failure, unspecified: Secondary | ICD-10-CM | POA: Diagnosis present

## 2011-06-01 DIAGNOSIS — F329 Major depressive disorder, single episode, unspecified: Secondary | ICD-10-CM | POA: Diagnosis present

## 2011-06-01 DIAGNOSIS — N184 Chronic kidney disease, stage 4 (severe): Secondary | ICD-10-CM | POA: Diagnosis present

## 2011-06-01 DIAGNOSIS — D649 Anemia, unspecified: Secondary | ICD-10-CM | POA: Diagnosis present

## 2011-06-01 DIAGNOSIS — I959 Hypotension, unspecified: Secondary | ICD-10-CM | POA: Diagnosis present

## 2011-06-01 DIAGNOSIS — E875 Hyperkalemia: Secondary | ICD-10-CM | POA: Diagnosis not present

## 2011-06-01 DIAGNOSIS — I10 Essential (primary) hypertension: Secondary | ICD-10-CM | POA: Diagnosis present

## 2011-06-01 DIAGNOSIS — N390 Urinary tract infection, site not specified: Secondary | ICD-10-CM | POA: Diagnosis present

## 2011-06-01 DIAGNOSIS — H9190 Unspecified hearing loss, unspecified ear: Secondary | ICD-10-CM | POA: Diagnosis present

## 2011-06-01 HISTORY — DX: Heart failure, unspecified: I50.9

## 2011-06-01 HISTORY — DX: Essential (primary) hypertension: I10

## 2011-06-01 HISTORY — DX: Dorsalgia, unspecified: M54.9

## 2011-06-01 HISTORY — DX: Endocarditis, valve unspecified: I38

## 2011-06-01 HISTORY — DX: Headache: R51

## 2011-06-01 HISTORY — DX: Bronchitis, not specified as acute or chronic: J40

## 2011-06-01 HISTORY — DX: Depression, unspecified: F32.A

## 2011-06-01 HISTORY — DX: Hypothyroidism, unspecified: E03.9

## 2011-06-01 HISTORY — DX: Major depressive disorder, single episode, unspecified: F32.9

## 2011-06-01 HISTORY — DX: Other chronic pain: G89.29

## 2011-06-01 LAB — DIFFERENTIAL
Eosinophils Absolute: 0 10*3/uL (ref 0.0–0.7)
Lymphocytes Relative: 4 % — ABNORMAL LOW (ref 12–46)
Lymphs Abs: 0.7 10*3/uL (ref 0.7–4.0)
Monocytes Relative: 5 % (ref 3–12)
Neutro Abs: 16.2 10*3/uL — ABNORMAL HIGH (ref 1.7–7.7)
Neutrophils Relative %: 91 % — ABNORMAL HIGH (ref 43–77)

## 2011-06-01 LAB — COMPREHENSIVE METABOLIC PANEL
ALT: 33 U/L (ref 0–35)
Alkaline Phosphatase: 119 U/L — ABNORMAL HIGH (ref 39–117)
BUN: 34 mg/dL — ABNORMAL HIGH (ref 6–23)
Chloride: 98 mEq/L (ref 96–112)
GFR calc Af Amer: 25 mL/min — ABNORMAL LOW (ref >90)
Glucose, Bld: 161 mg/dL — ABNORMAL HIGH (ref 70–99)
Potassium: 4.4 mEq/L (ref 3.5–5.1)
Sodium: 137 mEq/L (ref 135–145)
Total Bilirubin: 0.9 mg/dL (ref 0.3–1.2)
Total Protein: 7.4 g/dL (ref 6.0–8.3)

## 2011-06-01 LAB — LACTIC ACID, PLASMA: Lactic Acid, Venous: 1.5 mmol/L (ref 0.5–2.2)

## 2011-06-01 LAB — URINALYSIS, ROUTINE W REFLEX MICROSCOPIC
Bilirubin Urine: NEGATIVE
Hgb urine dipstick: NEGATIVE
Ketones, ur: NEGATIVE mg/dL
Specific Gravity, Urine: 1.012 (ref 1.005–1.030)
Urobilinogen, UA: 0.2 mg/dL (ref 0.0–1.0)
pH: 6.5 (ref 5.0–8.0)

## 2011-06-01 LAB — URINE CULTURE
Culture  Setup Time: 201301162013
Culture: NO GROWTH

## 2011-06-01 LAB — CBC
Hemoglobin: 11.8 g/dL — ABNORMAL LOW (ref 12.0–15.0)
Platelets: 234 10*3/uL (ref 150–400)
RBC: 3.99 MIL/uL (ref 3.87–5.11)
WBC: 17.7 10*3/uL — ABNORMAL HIGH (ref 4.0–10.5)

## 2011-06-01 MED ORDER — SODIUM CHLORIDE 0.9 % IV SOLN: Freq: Once | INTRAVENOUS | Status: DC

## 2011-06-01 MED ORDER — ONDANSETRON HCL 4 MG/2ML IJ SOLN
4.0000 mg | Freq: Once | INTRAMUSCULAR | Status: AC
  Administered 2011-06-01: 4 mg via INTRAVENOUS
  Filled 2011-06-01: qty 2

## 2011-06-01 MED ORDER — SODIUM CHLORIDE 0.9 % IV BOLUS (SEPSIS)
1000.0000 mL | Freq: Once | INTRAVENOUS | Status: AC
  Administered 2011-06-01: 1000 mL via INTRAVENOUS

## 2011-06-01 MED ORDER — CIPROFLOXACIN IN D5W 400 MG/200ML IV SOLN
400.0000 mg | Freq: Once | INTRAVENOUS | Status: DC
  Administered 2011-06-01: 400 mg via INTRAVENOUS
  Filled 2011-06-01: qty 200

## 2011-06-01 MED ORDER — DEXTROSE 5 % IV SOLN
1.0000 g | INTRAVENOUS | Status: DC
  Administered 2011-06-01: 1 g via INTRAVENOUS
  Filled 2011-06-01: qty 10

## 2011-06-01 MED ORDER — METRONIDAZOLE IN NACL 5-0.79 MG/ML-% IV SOLN
500.0000 mg | Freq: Once | INTRAVENOUS | Status: AC
  Administered 2011-06-01: 500 mg via INTRAVENOUS
  Filled 2011-06-01: qty 100

## 2011-06-01 NOTE — ED Notes (Signed)
Pt back from ct, pt requesting to go to restroom. Pt taken to restroom via w/c pt ambulated with standby assist. Pt states she is feeling better and wants to go home

## 2011-06-01 NOTE — ED Notes (Signed)
Pt presents with 2 day h/o lower abdominal pain that is intermittent.  +vomiting.  Pt seen at PCP and referred here.

## 2011-06-01 NOTE — ED Notes (Signed)
Report received from Jaci, RN.

## 2011-06-01 NOTE — ED Notes (Signed)
Received pt after report, pt is resting arouses easily. Pt states she is not having abd pain at this moment. Pt is drinking PO contrast, tolerating well.

## 2011-06-01 NOTE — H&P (Signed)
PCP:   Mickie Hillier, MD, MD  Cardiologist Dr. Katrinka Blazing  Chief Complaint:  Abdominal pain  HPI: Julie Mclean is a 78 y.o. female   has a past medical history of CHF (congestive heart failure); Hypertension; Depression; Hypothyroidism; and Chronic back pain.   Presented with  2 day history of abdominal pain, nausea and vomiting no diarhea. No fever. Occasioonal chest pain on and off this is chronic for her. Patient is hard of hearing so the history is difficult to obtain, no family at bed side  Review of Systems:    Pertinent positives include:chest pain, shortness of breath at rest., nausea, vomiting, abdominal pain  Constitutional:  No weight loss, night sweats, Fevers, chills, fatigue.  HEENT:  No headaches, Difficulty swallowing,Tooth/dental problems,Sore throat,  No sneezing, itching, ear ache, nasal congestion, post nasal drip,  Cardio-vascular:  No , Orthopnea, PND, anasarca, dizziness, palpitations.no Bilateral lower extremity swelling  GI:  No heartburn, indigestion, abdominal paindiarrhea, change in bowel habits, loss of appetite, melena, blood in stool, hematoemesis Resp:  no  No dyspnea on exertion, No excess mucus, no productive cough, No non-productive cough, No coughing up of blood.No change in color of mucus.No wheezing.No chest wall deformity  Skin:  no rash or lesions.  GU:  no dysuria, change in color of urine, no urgency or frequency. No flank pain.  Musculoskeletal:  No joint pain or swelling. No decreased range of motion. No back pain.  Psych:  No change in mood or affect. No depression or anxiety. No memory loss.  Neuro: no localizing neurological complaints, no tingling, no weakness, no double vision, no gait abnormality, no slurred speech   Otherwise ROS are negative except for above, 10 systems were reviewed  Past Medical History: Past Medical History  Diagnosis Date  . CHF (congestive heart failure)   . Hypertension   . Depression   .  Hypothyroidism   . Chronic back pain    History reviewed. No pertinent past surgical history.   Medications: Prior to Admission medications   Medication Sig Start Date End Date Taking? Authorizing Provider  aspirin 81 MG tablet Take 160 mg by mouth daily.   Yes Historical Provider, MD  gabapentin (NEURONTIN) 100 MG capsule Take 100 mg by mouth 2 (two) times daily.   Yes Historical Provider, MD  HYDROcodone-acetaminophen (NORCO) 5-325 MG per tablet Take 1 tablet by mouth every 6 (six) hours as needed. For pain   Yes Historical Provider, MD  promethazine (PHENERGAN) 25 MG suppository Place 25 mg rectally every 6 (six) hours as needed. For nausea   Yes Historical Provider, MD  temazepam (RESTORIL) 15 MG capsule Take 15 mg by mouth at bedtime as needed.   Yes Historical Provider, MD  Medication list is not complete  Allergies:   Allergies  Allergen Reactions  . Sulfamethoxazole W/Trimethoprim     REACTION: rash  . Sulfonamide Derivatives   . Ciprofloxacin Rash    Social History:  Ambulatory with cane Lives at home with husband   reports that she has never smoked. She does not have any smokeless tobacco history on file. She reports that she does not drink alcohol or use illicit drugs.   Family History: family history includes Lung cancer in her father and Stroke in her mother.    Physical Exam: Patient Vitals for the past 24 hrs:  BP Temp Temp src Pulse Resp SpO2 Height Weight  06/01/11 2339 103/37 mmHg 98.5 F (36.9 C) Oral - 18  97 % - -  06/01/11 2149 99/41 mmHg 100.7 F (38.2 C) Oral - 24  98 % - -  06/01/11 2126 109/42 mmHg 100.5 F (38.1 C) Oral - 16  93 % - -  06/01/11 2015 105/43 mmHg - - 85  22  98 % - -  06/01/11 2000 113/40 mmHg - - 83  24  98 % - -  06/01/11 1930 118/46 mmHg - - 84  20  99 % - -  06/01/11 1900 114/41 mmHg - Oral 86  25  98 % - -  06/01/11 1849 130/43 mmHg 99.9 F (37.7 C) Oral 87  20  90 % - -  06/01/11 1728 92/79 mmHg 99.6 F (37.6 C) Oral  87  20  97 % 5\' 4"  (1.626 m) 81.647 kg (180 lb)    1. General:  in No Acute distress, but diaphoretic 2. Psychological: Alert and Oriented 3. Head/ENT:    Dry Mucous Membranes                          Head Non traumatic, neck supple                          Normal  Dentition 4. SKIN: normalSkin turgor,  Skin clean Dry and intact no rash 5. Heart: Regular rate and rhythm no Murmur, Rub or gallop 6. Lungs: Clear to auscultation bilaterally, no wheezes or crackles   7. Abdomen: Soft, generally tender, Non distended 8. Lower extremities: no clubbing, cyanosis, or edema 9. Neurologically Grossly intact, moving all 4 extremities equally 10. MSK: Normal range of motion  body mass index is 30.90 kg/(m^2).   Labs on Admission:   [facility] 06/01/11 1817  NA 137  K 4.4  CL 98  CO2 29  GLUCOSE 161*  BUN 34*  CREATININE 2.05*  CALCIUM 10.0  MG --  PHOS --    Basename 06/01/11 1817  AST 33  ALT 33  ALKPHOS 119*  BILITOT 0.9  PROT 7.4  ALBUMIN 3.8    Basename 06/01/11 1817  LIPASE 16  AMYLASE --    Basename 06/01/11 1817  WBC 17.7*  NEUTROABS 16.2*  HGB 11.8*  HCT 35.6*  MCV 89.2  PLT 234   No results found for this basename: CKTOTAL:3,CKMB:3,CKMBINDEX:3,TROPONINI:3 in the last 72 hours No results found for this basename: TSH,T4TOTAL,FREET3,T3FREE,THYROIDAB in the last 72 hours No results found for this basename: VITAMINB12:2,FOLATE:2,FERRITIN:2,TIBC:2,IRON:2,RETICCTPCT:2 in the last 72 hours No results found for this basename: HGBA1C    Estimated Creatinine Clearance: 22.3 ml/min (by C-G formula based on Cr of 2.05). ABG    Component Value Date/Time   TCO2 29 04/20/2010 0907     No results found for this basename: DDIMER      UA 7-10 WBC   Cultures:    Component Value Date/Time   SDES URINE, RANDOM 04/20/2010 1102   SPECREQUEST NONE 04/20/2010 1102   CULT NO GROWTH 04/20/2010 1102   REPTSTATUS 04/21/2010 FINAL 04/20/2010 1102       Radiological Exams  on Admission: Ct Abdomen Pelvis Wo Contrast  06/01/2011  *RADIOLOGY REPORT*  Clinical Data: Diffuse abdominal pain and nausea.  Renal insufficiency.  CT ABDOMEN AND PELVIS WITHOUT CONTRAST  Technique:  Multidetector CT imaging of the abdomen and pelvis was performed following the standard protocol without intravenous contrast.  Comparison: Abdominal radiograph performed earlier today at 08:36 p.m.  Findings: The visualized lung bases are clear.  Calcification is  partially imaged at the aortic valve.  The liver and spleen are unremarkable in appearance.  A small vascular calcification and a clip are noted at the splenic hilum. The gallbladder is within normal limits.  The pancreas is unremarkable in appearance.  Mild nodularity at the left adrenal gland appears to reflect a small 1.2 cm low attenuation adrenal adenoma.  The right adrenal gland is unremarkable in appearance.  Nonspecific perinephric stranding is noted bilaterally.  A right- sided extrarenal pelvis is seen.  There is mild bilateral renal atrophy.  No renal or ureteral stones are seen.  There is no evidence of hydronephrosis.  No free fluid is identified.  The small bowel is unremarkable in appearance.  The stomach is grossly unremarkable; scattered clips are noted about the gastric fundus.  No acute vascular abnormalities are seen.  Mild scattered calcification is noted along the abdominal aorta and its branches.  The appendix is not seen; there is no evidence for appendicitis. Contrast progresses to the level of the transverse colon.  There is focal soft tissue inflammation and trace fluid noted about the distal sigmoid colon, with associate colonic wall thickening and inflamed diverticula.  There is a small focus of air that may be outside the colon, raising question for microperforation. Findings are compatible with acute diverticulitis.  There is no evidence of abscess formation.  Diverticulosis involves the distal descending and sigmoid colon.   The bladder is mildly distended and grossly unremarkable in appearance.  The patient is status post hysterectomy; no suspicious adnexal masses are seen.  No inguinal lymphadenopathy is seen.  No acute osseous abnormalities are identified.  There is mild grade 1 anterolisthesis of L4 on L5, reflecting facet disease.  Disc space narrowing is noted at L5-S1.  IMPRESSION:  1.  Acute diverticulitis involving the distal sigmoid colon, with chronic wall thickening, focal soft tissue inflammation, and trace fluid.  Small focus of air adjacent to the colon raises question for microperforation, though it could simply reflect a diverticulum.  No evidence of abscess formation. 2.  Diverticulosis involves the distal descending and sigmoid colon. 3.  Mild bilateral renal atrophy noted. 4.  Mild scattered calcification along the abdominal aorta and its branches. 5.  Small 1.2 cm left adrenal adenoma noted. 6.  Mild degenerative change at the lower lumbar spine.  Original Report Authenticated By: Tonia Ghent, M.D.   Dg Abd Acute W/chest  06/01/2011  *RADIOLOGY REPORT*  Clinical Data: Abdominal pain and nausea/vomiting for 2 days.  ACUTE ABDOMEN SERIES (ABDOMEN 2 VIEW & CHEST 1 VIEW)  Comparison: Chest x-ray from 04/20/2010.  Findings: Cardiac enlargement with calcified tortuous aorta.  Small right effusion.  Mild vascular congestion without overt failure or infiltrates.  No free air.  Scattered air-fluid levels appear nonobstructive.  Hyperdense but normal in size small bowel loops reflect oral contrast for upcoming CT.  No visible abnormal mild vascular calcification.  No definite ureteral calculi.  Moderate stool burden.  Demineralized bones.  IMPRESSION: Cardiomegaly with mild vascular congestion.  No active infiltrates or failure.  No evidence for bowel obstruction or free air.  Original Report Authenticated By: Elsie Stain, M.D.   Dg Abd Acute W/chest  06/01/2011  *RADIOLOGY REPORT*  Clinical Data: Hyperglycemia,  nausea, vomiting, diabetes  ACUTE ABDOMEN SERIES (ABDOMEN 2 VIEW & CHEST 1 VIEW)  Comparison: 04/20/2010, 01/10/2007  Findings: Stable cardiomegaly without CHF, pneumonia, collapse, consolidation, effusion or pneumothorax.  Trachea midline.  No free air.  Nonobstructive bowel gas pattern.  No  significant dilatation or ileus.  Pelvic calcifications likely venous phleboliths.  No acute osseous finding.  IMPRESSION: No acute findings in the chest or abdomen by plain radiography.  Original Report Authenticated By: Judie Petit. Ruel Favors, M.D.    Assessment/Plan  78 year old female with acute diverticulitis, UTI, intermittent chest pain and history of heart failure in no acute stenosis currently hypotensive will observe and step down.  Present on Admission:   .Diverticulitis - no admit to step down given severity of her elements, will put her on Zosyn and the patient is allergic to Cipro. If patient worsens will have surgical consult. CT scan showed possible microperforation. No abscess. Bowel rest. Spoke to Dr. Lindie Spruce who states will put her on a list to be seen in am. If decompensates would notify them. Marland KitchenHYPERTENSION - history of hypertension currently hypotensive we'll hold her regular medications  .Hypotension - patient is afebrile, appears dehydrated will see if she responds to IV fluids. Will give IV fluids monitor and step down give IV Zosyn. We'll monitor carefully for development of sepsis   .Acute renal failure - likely secondary to dehydration, will hold Lasix  .CKD (chronic kidney disease) -baseline creatinine around 1.5  .UTI (lower urinary tract infection) - should be covered by Zosyn await urine culture Hx of Aortic stenosis - will repeat echo gram Chest pain - she states she has them intermittently but will cycle cardiac markers repeat EKG in a.m.   Prophylaxis: SCD Protonix  CODE STATUS: Patient is unsure for now will keep her as full code   Abbey Veith 06/01/2011, 11:24 PM

## 2011-06-01 NOTE — ED Notes (Signed)
Dr Freida Busman at bedside speaking with pt and family about admission And plan of care

## 2011-06-01 NOTE — ED Notes (Signed)
cipro stopped d/t pt telling admitting physician she is allergic to cipro. Pt states she has "splotchiness and rash." No current signs of reaction to cipro infused. App 100 ml of cipro infused prior to stopping dose. Spoke with pharmacist about possible allergy. States an old chart noted sulfa as an allergy. Pt denied any allergies when cipro started

## 2011-06-01 NOTE — ED Provider Notes (Signed)
History     CSN: [id number]  Arrival date & time 06/01/11  1644   First MD Initiated Contact with Patient 06/01/11 1830      Chief Complaint  Patient presents with  . Abdominal Pain    (Consider location/radiation/quality/duration/timing/severity/associated sxs/prior treatment) The history is provided by the patient and the spouse.   patient presents with three-day history of abdominal pain diffuse in nature. No vomiting or diarrhea. No urinary symptoms. Pain is made better with nothing worse with nothing. No medications taken prior to arrival. Patient seen her primary care doctor's office and sent in for further evaluation. No prior history of similar symptoms. No recent rashes. No vaginal bleeding or discharge  Past Medical History  Diagnosis Date  . CHF (congestive heart failure)   . Hypertension     No past surgical history on file.  No family history on file.  History  Substance Use Topics  . Smoking status: Not on file  . Smokeless tobacco: Not on file  . Alcohol Use:     OB History    Grav Para Term Preterm Abortions TAB SAB Ect Mult Living                  Review of Systems  All other systems reviewed and are negative.    Allergies  Sulfamethoxazole w/trimethoprim and Sulfonamide derivatives  Home Medications   Current Outpatient Rx  Name Route Sig Dispense Refill  . HYDROCODONE-ACETAMINOPHEN 5-325 MG PO TABS Oral Take 1 tablet by mouth every 6 (six) hours as needed. For pain    . PROMETHAZINE HCL 25 MG RE SUPP Rectal Place 25 mg rectally every 6 (six) hours as needed. For nausea      BP 130/43  Pulse 87  Temp(Src) 99.9 F (37.7 C) (Oral)  Resp 20  Ht 5\' 4"  (1.626 m)  Wt 180 lb (81.647 kg)  BMI 30.90 kg/m2  SpO2 90%  Physical Exam  Nursing note and vitals reviewed. Constitutional: She is oriented to person, place, and time. She appears well-developed and well-nourished.  Non-toxic appearance. No distress.  HENT:  Head: Normocephalic and  atraumatic.  Eyes: Conjunctivae, EOM and lids are normal. Pupils are equal, round, and reactive to light.  Neck: Normal range of motion. Neck supple. No tracheal deviation present. No mass present.  Cardiovascular: Normal rate, regular rhythm and normal heart sounds.  Exam reveals no gallop.   No murmur heard. Pulmonary/Chest: Effort normal and breath sounds normal. No stridor. No respiratory distress. She has no decreased breath sounds. She has no wheezes. She has no rhonchi. She has no rales.  Abdominal: Soft. Normal appearance and bowel sounds are normal. She exhibits no distension. There is generalized tenderness. There is no rebound and no CVA tenderness.  Musculoskeletal: Normal range of motion. She exhibits no edema and no tenderness.  Neurological: She is alert and oriented to person, place, and time. She has normal strength. No cranial nerve deficit or sensory deficit. GCS eye subscore is 4. GCS verbal subscore is 5. GCS motor subscore is 6.  Skin: No abrasion and no rash noted. She is not diaphoretic. There is pallor.  Psychiatric: She has a normal mood and affect. Her speech is normal and behavior is normal.    ED Course  Procedures (including critical care time)  Labs Reviewed  CBC - Abnormal; Notable for the following:    WBC 17.7 (*)    Hemoglobin 11.8 (*)    HCT 35.6 (*)    All  other components within normal limits  DIFFERENTIAL - Abnormal; Notable for the following:    Neutrophils Relative 91 (*)    Neutro Abs 16.2 (*)    Lymphocytes Relative 4 (*)    All other components within normal limits  COMPREHENSIVE METABOLIC PANEL  LIPASE, BLOOD  URINALYSIS, ROUTINE W REFLEX MICROSCOPIC  URINE CULTURE   No results found.   No diagnosis found.    MDM   Date: 06/01/2011  Rate: 87  Rhythm: normal sinus rhythm  QRS Axis: normal  Intervals: normal  ST/T Wave abnormalities: normal  Conduction Disutrbances:none  Narrative Interpretation: inferior q-waves noted  Old  EKG Reviewed: unchanged   10:42 PM Patient started on IV antibiotics for her diverticulitis. Spoke with triad hospitalist, patient to be admitted       Toy Baker, MD 06/01/11 2243

## 2011-06-02 ENCOUNTER — Encounter (HOSPITAL_COMMUNITY): Payer: Self-pay | Admitting: *Deleted

## 2011-06-02 DIAGNOSIS — K5732 Diverticulitis of large intestine without perforation or abscess without bleeding: Secondary | ICD-10-CM

## 2011-06-02 LAB — CULTURE, BLOOD (ROUTINE X 2)
Culture  Setup Time: 201301170844
Culture: NO GROWTH

## 2011-06-02 LAB — CBC
HCT: 29.3 % — ABNORMAL LOW (ref 36.0–46.0)
Hemoglobin: 9.6 g/dL — ABNORMAL LOW (ref 12.0–15.0)
MCH: 29.1 pg (ref 26.0–34.0)
MCHC: 32.8 g/dL (ref 30.0–36.0)
MCV: 88.8 fL (ref 78.0–100.0)
RBC: 3.3 MIL/uL — ABNORMAL LOW (ref 3.87–5.11)

## 2011-06-02 LAB — CARDIAC PANEL(CRET KIN+CKTOT+MB+TROPI)
CK, MB: 1.3 ng/mL (ref 0.3–4.0)
CK, MB: 2.1 ng/mL (ref 0.3–4.0)
Troponin I: <0.3 ng/mL (ref <0.30)
Troponin I: <0.3 ng/mL (ref <0.30)

## 2011-06-02 LAB — COMPREHENSIVE METABOLIC PANEL
ALT: 49 U/L — ABNORMAL HIGH (ref 0–35)
AST: 44 U/L — ABNORMAL HIGH (ref 0–37)
Albumin: 2.9 g/dL — ABNORMAL LOW (ref 3.5–5.2)
CO2: 27 mEq/L (ref 19–32)
Calcium: 8.9 mg/dL (ref 8.4–10.5)
Creatinine, Ser: 1.89 mg/dL — ABNORMAL HIGH (ref 0.50–1.10)
GFR calc non Af Amer: 24 mL/min — ABNORMAL LOW (ref >90)
Sodium: 139 mEq/L (ref 135–145)

## 2011-06-02 LAB — MRSA PCR SCREENING: MRSA by PCR: NEGATIVE

## 2011-06-02 LAB — TSH: TSH: 0.497 u[IU]/mL (ref 0.350–4.500)

## 2011-06-02 LAB — PHOSPHORUS: Phosphorus: 3.3 mg/dL (ref 2.3–4.6)

## 2011-06-02 LAB — PROCALCITONIN: Procalcitonin: 5.16 ng/mL

## 2011-06-02 MED ORDER — SODIUM CHLORIDE 0.9 % IV SOLN
Freq: Once | INTRAVENOUS | Status: AC
  Administered 2011-06-02: 02:00:00 via INTRAVENOUS

## 2011-06-02 MED ORDER — ZOLPIDEM TARTRATE 5 MG PO TABS: 5.0000 mg | ORAL_TABLET | Freq: Every evening | ORAL | Status: DC | PRN

## 2011-06-02 MED ORDER — MORPHINE SULFATE 15 MG PO TABS
15.0000 mg | ORAL_TABLET | Freq: Four times a day (QID) | ORAL | Status: DC
  Administered 2011-06-02 – 2011-06-03 (×2): 15 mg via ORAL
  Filled 2011-06-02 (×3): qty 1

## 2011-06-02 MED ORDER — ONDANSETRON HCL 4 MG PO TABS
4.0000 mg | ORAL_TABLET | Freq: Four times a day (QID) | ORAL | Status: DC | PRN
  Administered 2011-06-06 – 2011-06-08 (×3): 4 mg via ORAL
  Filled 2011-06-02 (×3): qty 1

## 2011-06-02 MED ORDER — ACETAMINOPHEN 650 MG RE SUPP: 650.0000 mg | Freq: Four times a day (QID) | RECTAL | Status: DC | PRN

## 2011-06-02 MED ORDER — TEMAZEPAM 15 MG PO CAPS
15.0000 mg | ORAL_CAPSULE | Freq: Every day | ORAL | Status: DC
  Administered 2011-06-02 – 2011-06-07 (×6): 15 mg via ORAL
  Filled 2011-06-02 (×6): qty 1

## 2011-06-02 MED ORDER — PIPERACILLIN-TAZOBACTAM 3.375 G IVPB
3.3750 g | Freq: Three times a day (TID) | INTRAVENOUS | Status: DC
  Administered 2011-06-02 – 2011-06-05 (×11): 3.375 g via INTRAVENOUS
  Filled 2011-06-02 (×13): qty 50

## 2011-06-02 MED ORDER — FLUOXETINE HCL 20 MG PO CAPS
40.0000 mg | ORAL_CAPSULE | Freq: Every day | ORAL | Status: DC
  Administered 2011-06-03 – 2011-06-08 (×6): 40 mg via ORAL
  Filled 2011-06-02 (×8): qty 2

## 2011-06-02 MED ORDER — ASPIRIN 81 MG PO CHEW
81.0000 mg | CHEWABLE_TABLET | Freq: Every day | ORAL | Status: DC
  Administered 2011-06-02: 81 mg via ORAL
  Filled 2011-06-02 (×3): qty 1

## 2011-06-02 MED ORDER — ACETAMINOPHEN 325 MG PO TABS
650.0000 mg | ORAL_TABLET | Freq: Four times a day (QID) | ORAL | Status: DC | PRN
  Filled 2011-06-02 (×2): qty 2

## 2011-06-02 MED ORDER — ALBUTEROL SULFATE (5 MG/ML) 0.5% IN NEBU
2.5000 mg | INHALATION_SOLUTION | RESPIRATORY_TRACT | Status: DC | PRN
  Administered 2011-06-07: 2.5 mg via RESPIRATORY_TRACT
  Filled 2011-06-02: qty 0.5

## 2011-06-02 MED ORDER — TEMAZEPAM 15 MG PO CAPS: 15.0000 mg | ORAL_CAPSULE | Freq: Every evening | ORAL | Status: DC | PRN

## 2011-06-02 MED ORDER — ONDANSETRON HCL 4 MG/2ML IJ SOLN
4.0000 mg | Freq: Four times a day (QID) | INTRAMUSCULAR | Status: DC | PRN
  Administered 2011-06-02 (×3): 4 mg via INTRAVENOUS
  Filled 2011-06-02 (×3): qty 2

## 2011-06-02 MED ORDER — MORPHINE SULFATE 2 MG/ML IJ SOLN
2.0000 mg | INTRAMUSCULAR | Status: DC | PRN
  Administered 2011-06-02: 2 mg via INTRAVENOUS
  Administered 2011-06-02: 1 mg via INTRAVENOUS
  Administered 2011-06-02: 2 mg via INTRAVENOUS
  Administered 2011-06-02: 1 mg via INTRAVENOUS
  Administered 2011-06-02: 2 mg via INTRAVENOUS
  Filled 2011-06-02 (×5): qty 1

## 2011-06-02 MED ORDER — GABAPENTIN 100 MG PO CAPS
100.0000 mg | ORAL_CAPSULE | Freq: Two times a day (BID) | ORAL | Status: DC
  Administered 2011-06-02 – 2011-06-08 (×12): 100 mg via ORAL
  Filled 2011-06-02 (×15): qty 1

## 2011-06-02 NOTE — Consult Note (Signed)
Reason for Consult:Abdominal pain, nausea and vomiting.   Referring Physician: Debara Mclean is an 78 y.o. female.  HPI: Julie Mclean is a 78 year old female with a history of congestive heart failure and aortic stenosis who presents with 2 days of abdominal pain, going to her back. She has no prior history of abdominal discomfort. She has undergone colonoscopy in the past, and was noted to have a history of diverticulosis. She has no prior history of diverticulitis. Her only prior abdominal surgery was a hysterectomy , and hernia repair. Her symptoms have not improved and she presented to the emergency room with an elevated white count of 17,700. Acute abdominal series showed a small right effusion mild vascular congestion hyperdense but normal size small bowel loops.   CT scan showed acute diverticulitis involving the distal sigmoid colon with chronic thickening focal soft tissue inflammation trace fluid. There is a small focus of air adjacent to the colon with a concern for microperforation. She's been admitted and placed on IV Zosyn, And we were asked to see in consultation. Past Medical History  Diagnosis Date  . CHF (congestive heart failure)   . Hypertension   . Depression   . Hypothyroidism   . Chronic back pain   . Headache   . Bronchitis   . Heart valve disease   Aortic Stenosis  Past Surgical History  Procedure Date  . Tonsillectomy   . Abdominal hysterectomy   . Hernia repair     Family History  Problem Relation Age of Onset  . Stroke Mother   . Lung cancer Father     Social History:  reports that she has never smoked. She does not have any smokeless tobacco history on file. She reports that she does not drink alcohol or use illicit drugs.  Allergies:  Allergies  Allergen Reactions  . Ciprofloxacin Rash  . Sulfa Antibiotics Rash    Medications:  Prior to Admission:  Prescriptions prior to admission  Medication Sig Dispense Refill  . aspirin 81 MG chewable  tablet Chew 81 mg by mouth daily.      . beta carotene w/minerals (OCUVITE) tablet Take 1 tablet by mouth daily.      . Cholecalciferol (VITAMIN D PO) Take 1 tablet by mouth daily.       Marland Kitchen FLUoxetine (PROZAC) 40 MG capsule Take 40 mg by mouth daily.      . furosemide (LASIX) 40 MG tablet Take 40-80 mg by mouth daily. If gain 3-4 pounds over night      . gabapentin (NEURONTIN) 100 MG capsule Take 100 mg by mouth 2 (two) times daily.       Marland Kitchen losartan (COZAAR) 50 MG tablet Take 50 mg by mouth daily.      Marland Kitchen morphine (MSIR) 15 MG tablet Take 15 mg by mouth 4 (four) times daily.       . Multiple Vitamin (MULITIVITAMIN WITH MINERALS) TABS Take 1 tablet by mouth daily.      . simvastatin (ZOCOR) 20 MG tablet Take 20 mg by mouth at bedtime.       . temazepam (RESTORIL) 15 MG capsule Take 15 mg by mouth at bedtime.       Marland Kitchen trimethoprim (TRIMPEX) 100 MG tablet Take 100 mg by mouth every other day.        Scheduled:   . sodium chloride   Intravenous Once  . metronidazole  500 mg Intravenous Once  . ondansetron  4 mg Intravenous Once  .  ondansetron (ZOFRAN) IV  4 mg Intravenous Once  . piperacillin-tazobactam (ZOSYN)  IV  3.375 g Intravenous Q8H  . sodium chloride  1,000 mL Intravenous Once  . DISCONTD: sodium chloride   Intravenous Once  . DISCONTD: cefTRIAXone (ROCEPHIN)  IV  1 g Intravenous Q24H  . DISCONTD: ciprofloxacin  400 mg Intravenous Once   Continuous:  WUJ:[id number], acetaminophen, albuterol, morphine, ondansetron (ZOFRAN) IV, ondansetron, zolpidem, DISCONTD: temazepam  Results for orders placed during the hospital encounter of 06/01/11 (from the past 48 hour(s))  CBC     Status: Abnormal   Collection Time   06/01/11  6:17 PM      Component Value Range Comment   WBC 17.7 (*) 4.0 - 10.5 (K/uL)    RBC 3.99  3.87 - 5.11 (MIL/uL)    Hemoglobin 11.8 (*) 12.0 - 15.0 (g/dL)    HCT 13.0 (*) 86.5 - 46.0 (%)    MCV 89.2  78.0 - 100.0 (fL)    MCH 29.6  26.0 - 34.0 (pg)    MCHC 33.1   30.0 - 36.0 (g/dL)    RDW 78.4  69.6 - 29.5 (%)    Platelets 234  150 - 400 (K/uL)   DIFFERENTIAL     Status: Abnormal   Collection Time   06/01/11  6:17 PM      Component Value Range Comment   Neutrophils Relative 91 (*) 43 - 77 (%)    Neutro Abs 16.2 (*) 1.7 - 7.7 (K/uL)    Lymphocytes Relative 4 (*) 12 - 46 (%)    Lymphs Abs 0.7  0.7 - 4.0 (K/uL)    Monocytes Relative 5  3 - 12 (%)    Monocytes Absolute 0.8  0.1 - 1.0 (K/uL)    Eosinophils Relative 0  0 - 5 (%)    Eosinophils Absolute 0.0  0.0 - 0.7 (K/uL)    Basophils Relative 0  0 - 1 (%)    Basophils Absolute 0.0  0.0 - 0.1 (K/uL)   COMPREHENSIVE METABOLIC PANEL     Status: Abnormal   Collection Time   06/01/11  6:17 PM      Component Value Range Comment   Sodium 137  135 - 145 (mEq/L)    Potassium 4.4  3.5 - 5.1 (mEq/L)    Chloride 98  96 - 112 (mEq/L)    CO2 29  19 - 32 (mEq/L)    Glucose, Bld 161 (*) 70 - 99 (mg/dL)    BUN 34 (*) 6 - 23 (mg/dL)    Creatinine, Ser 2.84 (*) 0.50 - 1.10 (mg/dL)    Calcium 13.2  8.4 - 10.5 (mg/dL)    Total Protein 7.4  6.0 - 8.3 (g/dL)    Albumin 3.8  3.5 - 5.2 (g/dL)    AST 33  0 - 37 (U/L)    ALT 33  0 - 35 (U/L)    Alkaline Phosphatase 119 (*) 39 - 117 (U/L)    Total Bilirubin 0.9  0.3 - 1.2 (mg/dL)    GFR calc non Af Amer 22 (*) >90 (mL/min)    GFR calc Af Amer 25 (*) >90 (mL/min)   LIPASE, BLOOD     Status: Normal   Collection Time   06/01/11  6:17 PM      Component Value Range Comment   Lipase 16  11 - 59 (U/L)   URINALYSIS, ROUTINE W REFLEX MICROSCOPIC     Status: Abnormal   Collection Time   06/01/11  6:22  PM      Component Value Range Comment   Color, Urine YELLOW  YELLOW     APPearance CLOUDY (*) CLEAR     Specific Gravity, Urine 1.012  1.005 - 1.030     pH 6.5  5.0 - 8.0     Glucose, UA NEGATIVE  NEGATIVE (mg/dL)    Hgb urine dipstick NEGATIVE  NEGATIVE     Bilirubin Urine NEGATIVE  NEGATIVE     Ketones, ur NEGATIVE  NEGATIVE (mg/dL)    Protein, ur NEGATIVE  NEGATIVE  (mg/dL)    Urobilinogen, UA 0.2  0.0 - 1.0 (mg/dL)    Nitrite NEGATIVE  NEGATIVE     Leukocytes, UA MODERATE (*) NEGATIVE    URINE MICROSCOPIC-ADD ON     Status: Abnormal   Collection Time   06/01/11  6:22 PM      Component Value Range Comment   Squamous Epithelial / LPF FEW (*) RARE     WBC, UA 7-10  <3 (WBC/hpf)    RBC / HPF 0-2  <3 (RBC/hpf)    Bacteria, UA RARE  RARE     Urine-Other AMORPHOUS URATES/PHOSPHATES     LACTIC ACID, PLASMA     Status: Normal   Collection Time   06/01/11  6:50 PM      Component Value Range Comment   Lactic Acid, Venous 1.5  0.5 - 2.2 (mmol/L)   PROCALCITONIN     Status: Normal   Collection Time   06/02/11 12:55 AM      Component Value Range Comment   Procalcitonin 5.16     CARDIAC PANEL(CRET KIN+CKTOT+MB+TROPI)     Status: Normal   Collection Time   06/02/11  1:49 AM      Component Value Range Comment   Total CK 39  7 - 177 (U/L)    CK, MB 1.3  0.3 - 4.0 (ng/mL)    Troponin I <0.30  <0.30 (ng/mL)    Relative Index RELATIVE INDEX IS INVALID  0.0 - 2.5    MRSA PCR SCREENING     Status: Normal   Collection Time   06/02/11  2:09 AM      Component Value Range Comment   MRSA by PCR NEGATIVE  NEGATIVE    MAGNESIUM     Status: Normal   Collection Time   06/02/11  6:10 AM      Component Value Range Comment   Magnesium 2.4  1.5 - 2.5 (mg/dL)   PHOSPHORUS     Status: Normal   Collection Time   06/02/11  6:10 AM      Component Value Range Comment   Phosphorus 3.3  2.3 - 4.6 (mg/dL)   COMPREHENSIVE METABOLIC PANEL     Status: Abnormal   Collection Time   06/02/11  6:10 AM      Component Value Range Comment   Sodium 139  135 - 145 (mEq/L)    Potassium 4.1  3.5 - 5.1 (mEq/L)    Chloride 103  96 - 112 (mEq/L)    CO2 27  19 - 32 (mEq/L)    Glucose, Bld 144 (*) 70 - 99 (mg/dL)    BUN 35 (*) 6 - 23 (mg/dL)    Creatinine, Ser 0.86 (*) 0.50 - 1.10 (mg/dL)    Calcium 8.9  8.4 - 10.5 (mg/dL)    Total Protein 6.0  6.0 - 8.3 (g/dL)    Albumin 2.9 (*) 3.5 - 5.2  (g/dL)    AST 44 (*) 0 -  37 (U/L)    ALT 49 (*) 0 - 35 (U/L)    Alkaline Phosphatase 96  39 - 117 (U/L)    Total Bilirubin 0.7  0.3 - 1.2 (mg/dL)    GFR calc non Af Amer 24 (*) >90 (mL/min)    GFR calc Af Amer 28 (*) >90 (mL/min)   CBC     Status: Abnormal   Collection Time   06/02/11  6:10 AM      Component Value Range Comment   WBC 17.3 (*) 4.0 - 10.5 (K/uL)    RBC 3.30 (*) 3.87 - 5.11 (MIL/uL)    Hemoglobin 9.6 (*) 12.0 - 15.0 (g/dL)    HCT 40.9 (*) 81.1 - 46.0 (%)    MCV 88.8  78.0 - 100.0 (fL)    MCH 29.1  26.0 - 34.0 (pg)    MCHC 32.8  30.0 - 36.0 (g/dL)    RDW 91.4  78.2 - 95.6 (%)    Platelets 189  150 - 400 (K/uL)   CARDIAC PANEL(CRET KIN+CKTOT+MB+TROPI)     Status: Normal   Collection Time   06/02/11  6:10 AM      Component Value Range Comment   Total CK 42  7 - 177 (U/L)    CK, MB 1.6  0.3 - 4.0 (ng/mL)    Troponin I <0.30  <0.30 (ng/mL)    Relative Index RELATIVE INDEX IS INVALID  0.0 - 2.5      Ct Abdomen Pelvis Wo Contrast  06/01/2011  *RADIOLOGY REPORT*  Clinical Data: Diffuse abdominal pain and nausea.  Renal insufficiency.  CT ABDOMEN AND PELVIS WITHOUT CONTRAST  Technique:  Multidetector CT imaging of the abdomen and pelvis was performed following the standard protocol without intravenous contrast.  Comparison: Abdominal radiograph performed earlier today at 08:36 p.m.  Findings: The visualized lung bases are clear.  Calcification is partially imaged at the aortic valve.  The liver and spleen are unremarkable in appearance.  A small vascular calcification and a clip are noted at the splenic hilum. The gallbladder is within normal limits.  The pancreas is unremarkable in appearance.  Mild nodularity at the left adrenal gland appears to reflect a small 1.2 cm low attenuation adrenal adenoma.  The right adrenal gland is unremarkable in appearance.  Nonspecific perinephric stranding is noted bilaterally.  A right- sided extrarenal pelvis is seen.  There is mild bilateral  renal atrophy.  No renal or ureteral stones are seen.  There is no evidence of hydronephrosis.  No free fluid is identified.  The small bowel is unremarkable in appearance.  The stomach is grossly unremarkable; scattered clips are noted about the gastric fundus.  No acute vascular abnormalities are seen.  Mild scattered calcification is noted along the abdominal aorta and its branches.  The appendix is not seen; there is no evidence for appendicitis. Contrast progresses to the level of the transverse colon.  There is focal soft tissue inflammation and trace fluid noted about the distal sigmoid colon, with associate colonic wall thickening and inflamed diverticula.  There is a small focus of air that may be outside the colon, raising question for microperforation. Findings are compatible with acute diverticulitis.  There is no evidence of abscess formation.  Diverticulosis involves the distal descending and sigmoid colon.  The bladder is mildly distended and grossly unremarkable in appearance.  The patient is status post hysterectomy; no suspicious adnexal masses are seen.  No inguinal lymphadenopathy is seen.  No acute osseous abnormalities are identified.  There is mild grade 1 anterolisthesis of L4 on L5, reflecting facet disease.  Disc space narrowing is noted at L5-S1.  IMPRESSION:  1.  Acute diverticulitis involving the distal sigmoid colon, with chronic wall thickening, focal soft tissue inflammation, and trace fluid.  Small focus of air adjacent to the colon raises question for microperforation, though it could simply reflect a diverticulum.  No evidence of abscess formation. 2.  Diverticulosis involves the distal descending and sigmoid colon. 3.  Mild bilateral renal atrophy noted. 4.  Mild scattered calcification along the abdominal aorta and its branches. 5.  Small 1.2 cm left adrenal adenoma noted. 6.  Mild degenerative change at the lower lumbar spine.  Original Report Authenticated By: Tonia Ghent,  M.D.   Dg Abd Acute W/chest  06/01/2011  *RADIOLOGY REPORT*  Clinical Data: Abdominal pain and nausea/vomiting for 2 days.  ACUTE ABDOMEN SERIES (ABDOMEN 2 VIEW & CHEST 1 VIEW)  Comparison: Chest x-ray from 04/20/2010.  Findings: Cardiac enlargement with calcified tortuous aorta.  Small right effusion.  Mild vascular congestion without overt failure or infiltrates.  No free air.  Scattered air-fluid levels appear nonobstructive.  Hyperdense but normal in size small bowel loops reflect oral contrast for upcoming CT.  No visible abnormal mild vascular calcification.  No definite ureteral calculi.  Moderate stool burden.  Demineralized bones.  IMPRESSION: Cardiomegaly with mild vascular congestion.  No active infiltrates or failure.  No evidence for bowel obstruction or free air.  Original Report Authenticated By: Elsie Stain, M.D.   Dg Abd Acute W/chest  06/01/2011  *RADIOLOGY REPORT*  Clinical Data: Hyperglycemia, nausea, vomiting, diabetes  ACUTE ABDOMEN SERIES (ABDOMEN 2 VIEW & CHEST 1 VIEW)  Comparison: 04/20/2010, 01/10/2007  Findings: Stable cardiomegaly without CHF, pneumonia, collapse, consolidation, effusion or pneumothorax.  Trachea midline.  No free air.  Nonobstructive bowel gas pattern.  No significant dilatation or ileus.  Pelvic calcifications likely venous phleboliths.  No acute osseous finding.  IMPRESSION: No acute findings in the chest or abdomen by plain radiography.  Original Report Authenticated By: Judie Petit. Ruel Favors, M.D.    Review of Systems  Constitutional: Negative.   HENT: Positive for hearing loss. Negative for ear pain, congestion and sore throat.   Eyes: Negative.   Respiratory: Positive for shortness of breath (DOE WITH ANY EXERTION.  Husband does all the house work). Negative for cough, hemoptysis and sputum production.   Cardiovascular: Negative for chest pain, palpitations, orthopnea, claudication, leg swelling and PND.       Chronic DOE  Gastrointestinal: Positive  for constipation (chronic on Miralax daily). Negative for nausea, vomiting and abdominal pain.  Genitourinary: Negative.   Musculoskeletal: Positive for back pain (Chronic back pain on neurontin and MS).  Neurological: Negative.   Psychiatric/Behavioral: Positive for depression.   Blood pressure 107/41, pulse 77, temperature 99 F (37.2 C), temperature source Oral, resp. rate 20, height 5\' 4"  (1.626 m), weight 83.2 kg (183 lb 6.8 oz), SpO2 96.00%. Physical Exam  Constitutional: She is oriented to person, place, and time. She appears well-developed and well-nourished. No distress.  HENT:  Head: Normocephalic and atraumatic.       Extremely hard of hearing  Eyes: Conjunctivae and EOM are normal. Pupils are equal, round, and reactive to light.  Cardiovascular: Normal rate and regular rhythm.  Exam reveals no gallop and no friction rub.   Murmur (II-II/VI Aortic mumur.) heard. Respiratory: Effort normal. She has rales (in bases). She exhibits no tenderness.  GI: Soft. Bowel sounds are  normal. There is tenderness (Very tender all over and even with cough.). There is no rebound and no guarding.  Musculoskeletal: She exhibits no edema.  Neurological: She is alert and oriented to person, place, and time. No cranial nerve deficit.  Skin: Skin is warm. She is diaphoretic.  Psychiatric: She has a normal mood and affect. Her behavior is normal. Judgment and thought content normal.    Assessment/Plan: 1. Abdominal pain with acute diverticulitis distal sigmoid colon and possible microperforation. History of constipation; On Zosyn. 2. History of congestive heart failure and aortic stenosis. Mild vascular congestion on admission. 3. History of hypertension. 4. History of hypothyroid. 5. History of chronic back pain, patient reports she is on morphine and Neurontin. 6. History depression.  Plan: Agree with medical management antibiotics. We will follow with you, followup scan 5-7  days.   Shelby Peltz 06/02/2011, 9:40 AM

## 2011-06-02 NOTE — Progress Notes (Addendum)
Subjective: She is sleeping when I enter the room. Easily awakened. Complains of pain across lower abdomen and some nausea. No vomiting.   Objective: Blood pressure 107/41, pulse 77, temperature 98.2 F (36.8 C), temperature source Oral, resp. rate 20, height 5\' 4"  (1.626 m), weight 83.2 kg (183 lb 6.8 oz), SpO2 96.00%. Weight change:   Intake/Output Summary (Last 24 hours) at 06/02/11 1642 Last data filed at 06/02/11 0400  Gross per 24 hour  Intake    245 ml  Output      0 ml  Net    245 ml    Physical Exam: General appearance: alert and cooperative Head: Normocephalic, without obvious abnormality, atraumatic Throat: lips, mucosa, and tongue normal; teeth and gums normal Lungs: clear to auscultation bilaterally Heart: regular rate and rhythm, S1, S2 normal, Aortic murmur 2/6, click, rub or gallop Abdomen: soft, tender in suprapubic area and LLL; bowel sounds normal; no masses,  no organomegaly Extremities: extremities normal, atraumatic, no cyanosis or edema  Lab Results:  [facility] 06/02/11 0610 06/01/11 1817  NA 139 137  K 4.1 4.4  CL 103 98  CO2 27 29  GLUCOSE 144* 161*  BUN 35* 34*  CREATININE 1.89* 2.05*  CALCIUM 8.9 10.0  MG 2.4 --  PHOS 3.3 --    Basename 06/02/11 0610 06/01/11 1817  AST 44* 33  ALT 49* 33  ALKPHOS 96 119*  BILITOT 0.7 0.9  PROT 6.0 7.4  ALBUMIN 2.9* 3.8    Basename 06/01/11 1817  LIPASE 16  AMYLASE --    Basename 06/02/11 0610 06/01/11 1817  WBC 17.3* 17.7*  NEUTROABS -- 16.2*  HGB 9.6* 11.8*  HCT 29.3* 35.6*  MCV 88.8 89.2  PLT 189 234    Basename 06/02/11 1143 06/02/11 0610 06/02/11 0149  CKTOTAL 60 42 39  CKMB 2.1 1.6 1.3  CKMBINDEX -- -- --  TROPONINI <0.30 <0.30 <0.30   No components found with this basename: POCBNP:3 No results found for this basename: DDIMER:2 in the last 72 hours No results found for this basename: HGBA1C:2 in the last 72 hours No results found for this basename:  CHOL:2,HDL:2,LDLCALC:2,TRIG:2,CHOLHDL:2,LDLDIRECT:2 in the last 72 hours No results found for this basename: TSH,T4TOTAL,FREET3,T3FREE,THYROIDAB in the last 72 hours No results found for this basename: VITAMINB12:2,FOLATE:2,FERRITIN:2,TIBC:2,IRON:2,RETICCTPCT:2 in the last 72 hours  Micro Results: Recent Results (from the past 240 hour(s))  MRSA PCR SCREENING     Status: Normal   Collection Time   06/02/11  2:09 AM      Component Value Range Status Comment   MRSA by PCR NEGATIVE  NEGATIVE  Final     Studies/Results: Ct Abdomen Pelvis Wo Contrast  06/01/2011  *RADIOLOGY REPORT*  Clinical Data: Diffuse abdominal pain and nausea.  Renal insufficiency.  CT ABDOMEN AND PELVIS WITHOUT CONTRAST  Technique:  Multidetector CT imaging of the abdomen and pelvis was performed following the standard protocol without intravenous contrast.  Comparison: Abdominal radiograph performed earlier today at 08:36 p.m.  Findings: The visualized lung bases are clear.  Calcification is partially imaged at the aortic valve.  The liver and spleen are unremarkable in appearance.  A small vascular calcification and a clip are noted at the splenic hilum. The gallbladder is within normal limits.  The pancreas is unremarkable in appearance.  Mild nodularity at the left adrenal gland appears to reflect a small 1.2 cm low attenuation adrenal adenoma.  The right adrenal gland is unremarkable in appearance.  Nonspecific perinephric stranding is noted bilaterally.  A right-  sided extrarenal pelvis is seen.  There is mild bilateral renal atrophy.  No renal or ureteral stones are seen.  There is no evidence of hydronephrosis.  No free fluid is identified.  The small bowel is unremarkable in appearance.  The stomach is grossly unremarkable; scattered clips are noted about the gastric fundus.  No acute vascular abnormalities are seen.  Mild scattered calcification is noted along the abdominal aorta and its branches.  The appendix is not seen;  there is no evidence for appendicitis. Contrast progresses to the level of the transverse colon.  There is focal soft tissue inflammation and trace fluid noted about the distal sigmoid colon, with associate colonic wall thickening and inflamed diverticula.  There is a small focus of air that may be outside the colon, raising question for microperforation. Findings are compatible with acute diverticulitis.  There is no evidence of abscess formation.  Diverticulosis involves the distal descending and sigmoid colon.  The bladder is mildly distended and grossly unremarkable in appearance.  The patient is status post hysterectomy; no suspicious adnexal masses are seen.  No inguinal lymphadenopathy is seen.  No acute osseous abnormalities are identified.  There is mild grade 1 anterolisthesis of L4 on L5, reflecting facet disease.  Disc space narrowing is noted at L5-S1.  IMPRESSION:  1.  Acute diverticulitis involving the distal sigmoid colon, with chronic wall thickening, focal soft tissue inflammation, and trace fluid.  Small focus of air adjacent to the colon raises question for microperforation, though it could simply reflect a diverticulum.  No evidence of abscess formation. 2.  Diverticulosis involves the distal descending and sigmoid colon. 3.  Mild bilateral renal atrophy noted. 4.  Mild scattered calcification along the abdominal aorta and its branches. 5.  Small 1.2 cm left adrenal adenoma noted. 6.  Mild degenerative change at the lower lumbar spine.  Original Report Authenticated By: Tonia Ghent, M.D.   Dg Abd Acute W/chest  06/01/2011  *RADIOLOGY REPORT*  Clinical Data: Abdominal pain and nausea/vomiting for 2 days.  ACUTE ABDOMEN SERIES (ABDOMEN 2 VIEW & CHEST 1 VIEW)  Comparison: Chest x-ray from 04/20/2010.  Findings: Cardiac enlargement with calcified tortuous aorta.  Small right effusion.  Mild vascular congestion without overt failure or infiltrates.  No free air.  Scattered air-fluid levels  appear nonobstructive.  Hyperdense but normal in size small bowel loops reflect oral contrast for upcoming CT.  No visible abnormal mild vascular calcification.  No definite ureteral calculi.  Moderate stool burden.  Demineralized bones.  IMPRESSION: Cardiomegaly with mild vascular congestion.  No active infiltrates or failure.  No evidence for bowel obstruction or free air.  Original Report Authenticated By: Elsie Stain, M.D.   Dg Abd Acute W/chest  06/01/2011  *RADIOLOGY REPORT*  Clinical Data: Hyperglycemia, nausea, vomiting, diabetes  ACUTE ABDOMEN SERIES (ABDOMEN 2 VIEW & CHEST 1 VIEW)  Comparison: 04/20/2010, 01/10/2007  Findings: Stable cardiomegaly without CHF, pneumonia, collapse, consolidation, effusion or pneumothorax.  Trachea midline.  No free air.  Nonobstructive bowel gas pattern.  No significant dilatation or ileus.  Pelvic calcifications likely venous phleboliths.  No acute osseous finding.  IMPRESSION: No acute findings in the chest or abdomen by plain radiography.  Original Report Authenticated By: Judie Petit. Ruel Favors, M.D.    Medications: Scheduled Meds:   . sodium chloride   Intravenous Once  . metronidazole  500 mg Intravenous Once  . ondansetron  4 mg Intravenous Once  . ondansetron (ZOFRAN) IV  4 mg Intravenous Once  .  piperacillin-tazobactam (ZOSYN)  IV  3.375 g Intravenous Q8H  . sodium chloride  1,000 mL Intravenous Once  . DISCONTD: sodium chloride   Intravenous Once  . DISCONTD: cefTRIAXone (ROCEPHIN)  IV  1 g Intravenous Q24H  . DISCONTD: ciprofloxacin  400 mg Intravenous Once   Continuous Infusions:  PRN Meds:.acetaminophen, acetaminophen, albuterol, morphine, ondansetron (ZOFRAN) IV, ondansetron, zolpidem, DISCONTD: temazepam  Assessment/Plan: Principal Problem:  *Diverticulitis, Sigmoid- cont Zosyn. Start clears.   Active Problems:    Hypotension with a history of HYPERTENSION - holding cozaar   Acute renal failure on CKD III- baseline Cr near 1.4. Cont  to hold Lasix for today.    UTI (lower urinary tract infection)- culture pending.   Moderate Aortic Stenosis Hypothyroid Hard of Hearing   LOS: 1 day   [facility] [phone] 06/02/2011, 4:42 PM

## 2011-06-02 NOTE — ED Notes (Signed)
Lab at bedside drawing labs

## 2011-06-02 NOTE — Progress Notes (Signed)
UR COMPLETED  PTA PT WAS AT HOME, PT/OT EVAL ORDERED FOR DC PLANS Willa Rough 06/02/2011 [phone] OR [phone]

## 2011-06-02 NOTE — ED Notes (Signed)
Pt resting, appears to be sleeping arouses easily. Awaiting bed assignment no needs at this time.

## 2011-06-02 NOTE — ED Notes (Signed)
Report called to [place], RN on [phone]

## 2011-06-02 NOTE — Consult Note (Signed)
abd soft, nd, obese, mild diffuse ttp. More TTP in LLQ and lower abd, no RT, no peritonitis  Sigmoid diverticulitis with possible microperforation  Plan bowel rest, IV abx Question need for KVO ivf in setting of acute kidney injury - rec IVF  I saw the patient, participated in the history, exam and medical decision making, and concur with the physician assistant's note above.  Keora Sella. Andrey Campanile, MD, FACS General, Bariatric, & Minimally Invasive Surgery [facility], [place]

## 2011-06-02 NOTE — Progress Notes (Signed)
  Echocardiogram 2D Echocardiogram has been performed.  Julie Mclean Nira Retort 06/02/2011, 10:37 AM

## 2011-06-02 NOTE — Plan of Care (Signed)
Problem: Consults Goal: General Medical Patient Education See Patient Education Module for specific education. Outcome: Progressing Abdominal pain, diverticulitis

## 2011-06-03 MED ORDER — ASPIRIN 81 MG PO CHEW
81.0000 mg | CHEWABLE_TABLET | Freq: Every day | ORAL | Status: DC
  Administered 2011-06-03 – 2011-06-07 (×5): 81 mg via ORAL
  Filled 2011-06-03 (×5): qty 1

## 2011-06-03 MED ORDER — MORPHINE SULFATE 15 MG PO TABS
15.0000 mg | ORAL_TABLET | Freq: Four times a day (QID) | ORAL | Status: DC
  Administered 2011-06-03 – 2011-06-08 (×19): 15 mg via ORAL
  Filled 2011-06-03 (×19): qty 1

## 2011-06-03 MED ORDER — MORPHINE SULFATE 15 MG PO TABS
15.0000 mg | ORAL_TABLET | Freq: Four times a day (QID) | ORAL | Status: DC
  Filled 2011-06-03: qty 1

## 2011-06-03 MED ORDER — ENOXAPARIN SODIUM 40 MG/0.4ML ~~LOC~~ SOLN
40.0000 mg | SUBCUTANEOUS | Status: DC
  Administered 2011-06-03: 40 mg via SUBCUTANEOUS
  Filled 2011-06-03 (×2): qty 0.4

## 2011-06-03 NOTE — Progress Notes (Signed)
States she feels better. Says less abd pain.   abd obese, soft, still ttp LLQ & suprapubic - maybe a little less than yesterday  Do not adv diet Cont iv abx Follow abd exam and cbc - no cbc for today. i ordered one for Saturday. Rec starting chemical VTE prophylaxis (lovenox or heparin)  Aleaha Sella. Andrey Campanile, MD, FACS General, Bariatric, & Minimally Invasive Surgery [facility], [place]

## 2011-06-03 NOTE — Progress Notes (Signed)
Subjective: She feels much better  Today.  Pain has improved a great deal.  Tolerating diet.   Objective: Blood pressure 101/40, pulse 77, temperature 98.8 F (37.1 C), temperature source Oral, resp. rate 14, height 5\' 4"  (1.626 m), weight 83.2 kg (183 lb 6.8 oz), SpO2 95.00%. Weight change:   Intake/Output Summary (Last 24 hours) at 06/03/11 2126 Last data filed at 06/03/11 0500  Gross per 24 hour  Intake    440 ml  Output      0 ml  Net    440 ml    Physical Exam: General appearance: alert and cooperative Head: Normocephalic, without obvious abnormality, atraumatic Throat: lips, mucosa, and tongue normal; teeth and gums normal Lungs: clear to auscultation bilaterally Heart: regular rate and rhythm, S1, S2 normal, Aortic murmur 2/6, click, rub or gallop Abdomen: soft,mildly tender; bowel sounds normal; no masses,  no organomegaly Extremities: extremities normal, atraumatic, no cyanosis or edema  Lab Results:  [facility] 06/02/11 0610 06/01/11 1817  NA 139 137  K 4.1 4.4  CL 103 98  CO2 27 29  GLUCOSE 144* 161*  BUN 35* 34*  CREATININE 1.89* 2.05*  CALCIUM 8.9 10.0  MG 2.4 --  PHOS 3.3 --    Basename 06/02/11 0610 06/01/11 1817  AST 44* 33  ALT 49* 33  ALKPHOS 96 119*  BILITOT 0.7 0.9  PROT 6.0 7.4  ALBUMIN 2.9* 3.8    Basename 06/01/11 1817  LIPASE 16  AMYLASE --    Basename 06/02/11 0610 06/01/11 1817  WBC 17.3* 17.7*  NEUTROABS -- 16.2*  HGB 9.6* 11.8*  HCT 29.3* 35.6*  MCV 88.8 89.2  PLT 189 234    Basename 06/02/11 1143 06/02/11 0610 06/02/11 0149  CKTOTAL 60 42 39  CKMB 2.1 1.6 1.3  CKMBINDEX -- -- --  TROPONINI <0.30 <0.30 <0.30   No components found with this basename: POCBNP:3 No results found for this basename: DDIMER:2 in the last 72 hours No results found for this basename: HGBA1C:2 in the last 72 hours No results found for this basename: CHOL:2,HDL:2,LDLCALC:2,TRIG:2,CHOLHDL:2,LDLDIRECT:2 in the last 72 hours  Basename 06/02/11 0610   TSH 0.497  T4TOTAL --  T3FREE --  THYROIDAB --   No results found for this basename: VITAMINB12:2,FOLATE:2,FERRITIN:2,TIBC:2,IRON:2,RETICCTPCT:2 in the last 72 hours  Micro Results: Recent Results (from the past 240 hour(s))  URINE CULTURE     Status: Normal   Collection Time   06/01/11  6:38 PM      Component Value Range Status Comment   Specimen Description URINE, CLEAN CATCH   Final    Special Requests NONE   Final    Setup Time [id number]   Final    Colony Count NO GROWTH   Final    Culture NO GROWTH   Final    Report Status 06/02/2011 FINAL   Final   CULTURE, BLOOD (ROUTINE X 2)     Status: Normal (Preliminary result)   Collection Time   06/02/11 12:50 AM      Component Value Range Status Comment   Specimen Description BLOOD LEFT ARM   Final    Special Requests     Final    Value: BOTTLES DRAWN AEROBIC AND ANAEROBIC 10CC EACH PATIENT ON FOLLOWING ROCEPHIN CIPRO FLAGYL   Setup Time [id number]   Final    Culture     Final    Value:        BLOOD CULTURE RECEIVED NO GROWTH TO DATE CULTURE WILL BE HELD FOR 5  DAYS BEFORE ISSUING A FINAL NEGATIVE REPORT   Report Status PENDING   Incomplete   CULTURE, BLOOD (ROUTINE X 2)     Status: Normal (Preliminary result)   Collection Time   06/02/11  1:00 AM      Component Value Range Status Comment   Specimen Description BLOOD LEFT HAND   Final    Special Requests     Final    Value: BOTTLES DRAWN AEROBIC ONLY 10CC PATIENT ON FOLLOWING ROCEPHIN CIPRO FLAGYL   Setup Time [id number]   Final    Culture     Final    Value:        BLOOD CULTURE RECEIVED NO GROWTH TO DATE CULTURE WILL BE HELD FOR 5 DAYS BEFORE ISSUING A FINAL NEGATIVE REPORT   Report Status PENDING   Incomplete   MRSA PCR SCREENING     Status: Normal   Collection Time   06/02/11  2:09 AM      Component Value Range Status Comment   MRSA by PCR NEGATIVE  NEGATIVE  Final     Studies/Results: Ct Abdomen Pelvis Wo Contrast  06/01/2011  *RADIOLOGY REPORT*  Clinical Data:  Diffuse abdominal pain and nausea.  Renal insufficiency.  CT ABDOMEN AND PELVIS WITHOUT CONTRAST  Technique:  Multidetector CT imaging of the abdomen and pelvis was performed following the standard protocol without intravenous contrast.  Comparison: Abdominal radiograph performed earlier today at 08:36 p.m.  Findings: The visualized lung bases are clear.  Calcification is partially imaged at the aortic valve.  The liver and spleen are unremarkable in appearance.  A small vascular calcification and a clip are noted at the splenic hilum. The gallbladder is within normal limits.  The pancreas is unremarkable in appearance.  Mild nodularity at the left adrenal gland appears to reflect a small 1.2 cm low attenuation adrenal adenoma.  The right adrenal gland is unremarkable in appearance.  Nonspecific perinephric stranding is noted bilaterally.  A right- sided extrarenal pelvis is seen.  There is mild bilateral renal atrophy.  No renal or ureteral stones are seen.  There is no evidence of hydronephrosis.  No free fluid is identified.  The small bowel is unremarkable in appearance.  The stomach is grossly unremarkable; scattered clips are noted about the gastric fundus.  No acute vascular abnormalities are seen.  Mild scattered calcification is noted along the abdominal aorta and its branches.  The appendix is not seen; there is no evidence for appendicitis. Contrast progresses to the level of the transverse colon.  There is focal soft tissue inflammation and trace fluid noted about the distal sigmoid colon, with associate colonic wall thickening and inflamed diverticula.  There is a small focus of air that may be outside the colon, raising question for microperforation. Findings are compatible with acute diverticulitis.  There is no evidence of abscess formation.  Diverticulosis involves the distal descending and sigmoid colon.  The bladder is mildly distended and grossly unremarkable in appearance.  The patient is status  post hysterectomy; no suspicious adnexal masses are seen.  No inguinal lymphadenopathy is seen.  No acute osseous abnormalities are identified.  There is mild grade 1 anterolisthesis of L4 on L5, reflecting facet disease.  Disc space narrowing is noted at L5-S1.  IMPRESSION:  1.  Acute diverticulitis involving the distal sigmoid colon, with chronic wall thickening, focal soft tissue inflammation, and trace fluid.  Small focus of air adjacent to the colon raises question for microperforation, though it could simply reflect a  diverticulum.  No evidence of abscess formation. 2.  Diverticulosis involves the distal descending and sigmoid colon. 3.  Mild bilateral renal atrophy noted. 4.  Mild scattered calcification along the abdominal aorta and its branches. 5.  Small 1.2 cm left adrenal adenoma noted. 6.  Mild degenerative change at the lower lumbar spine.  Original Report Authenticated By: Tonia Ghent, M.D.   Dg Abd Acute W/chest  06/01/2011  *RADIOLOGY REPORT*  Clinical Data: Abdominal pain and nausea/vomiting for 2 days.  ACUTE ABDOMEN SERIES (ABDOMEN 2 VIEW & CHEST 1 VIEW)  Comparison: Chest x-ray from 04/20/2010.  Findings: Cardiac enlargement with calcified tortuous aorta.  Small right effusion.  Mild vascular congestion without overt failure or infiltrates.  No free air.  Scattered air-fluid levels appear nonobstructive.  Hyperdense but normal in size small bowel loops reflect oral contrast for upcoming CT.  No visible abnormal mild vascular calcification.  No definite ureteral calculi.  Moderate stool burden.  Demineralized bones.  IMPRESSION: Cardiomegaly with mild vascular congestion.  No active infiltrates or failure.  No evidence for bowel obstruction or free air.  Original Report Authenticated By: Elsie Stain, M.D.   Dg Abd Acute W/chest  06/01/2011  *RADIOLOGY REPORT*  Clinical Data: Hyperglycemia, nausea, vomiting, diabetes  ACUTE ABDOMEN SERIES (ABDOMEN 2 VIEW & CHEST 1 VIEW)  Comparison:  04/20/2010, 01/10/2007  Findings: Stable cardiomegaly without CHF, pneumonia, collapse, consolidation, effusion or pneumothorax.  Trachea midline.  No free air.  Nonobstructive bowel gas pattern.  No significant dilatation or ileus.  Pelvic calcifications likely venous phleboliths.  No acute osseous finding.  IMPRESSION: No acute findings in the chest or abdomen by plain radiography.  Original Report Authenticated By: Judie Petit. Ruel Favors, M.D.    Medications: Scheduled Meds:    . aspirin  81 mg Oral Daily  . enoxaparin (LOVENOX) injection  40 mg Subcutaneous Q24H  . FLUoxetine  40 mg Oral Daily  . gabapentin  100 mg Oral BID  . morphine  15 mg Oral QID  . piperacillin-tazobactam (ZOSYN)  IV  3.375 g Intravenous Q8H  . temazepam  15 mg Oral QHS  . DISCONTD: aspirin  81 mg Oral Daily  . DISCONTD: morphine  15 mg Oral QID  . DISCONTD: morphine  15 mg Oral QID   Continuous Infusions:  PRN Meds:.acetaminophen, acetaminophen, albuterol, morphine, ondansetron (ZOFRAN) IV, ondansetron, zolpidem  Assessment/Plan: Principal Problem:  Diverticulitis, Sigmoid- cont Zosyn. Started on clears.     Hypotension with a history of HYPERTENSION - holding cozaar   Acute renal failure on CKD III- baseline Cr near 1.4. Cont to hold Lasix for now.    UTI (lower urinary tract infection)- culture no growth.   Moderate Aortic Stenosis Hypothyroid Hard of Hearing   LOS: 2 days   Earlene Plater MD, [place] [phone] 06/03/2011, 9:26 PM

## 2011-06-03 NOTE — Progress Notes (Signed)
Subjective: Pt alert this morning when I walked in.  States she had a BM last night that was "rather larger" but a "normal" consistency.  She also had a few complaints of some "crampiness" in her lower abdomen, which has now resolved.  She states that she normally takes Miralax every day but has not taken it since she has been in the hospital.  Was wondering if this would help her BM's.  No other concerns or complaints.  Objective: Vital signs in last 24 hours: Temp:  [97.5 F (36.4 C)-98.8 F (37.1 C)] 98.8 F (37.1 C) (01/18 0515) Pulse Rate:  [76-86] 76  (01/18 0515) Resp:  [16-25] 16  (01/18 0515) BP: (100-130)/(36-55) 100/48 mmHg (01/18 0515) SpO2:  [96 %-100 %] 98 % (01/18 0515) Last BM Date: 06/01/11  Intake/Output this shift:    Physical Exam: BP 100/48  Pulse 76  Temp(Src) 98.8 F (37.1 C) (Oral)  Resp 16  Ht 5\' 4"  (1.626 m)  Wt 83.2 kg (183 lb 6.8 oz)  BMI 31.48 kg/m2  SpO2 98% General: Well developed, overweight elderly female in NAD. Abdomen: Soft, still tender, non distended BS present x 4.  Labs: CBC  Basename 06/02/11 0610 06/01/11 1817  WBC 17.3* 17.7*  HGB 9.6* 11.8*  HCT 29.3* 35.6*  PLT 189 234   BMET  Basename 06/02/11 0610 06/01/11 1817  NA 139 137  K 4.1 4.4  CL 103 98  CO2 27 29  GLUCOSE 144* 161*  BUN 35* 34*  CREATININE 1.89* 2.05*  CALCIUM 8.9 10.0   LFT  Basename 06/02/11 0610 06/01/11 1817  PROT 6.0 --  ALBUMIN 2.9* --  AST 44* --  ALT 49* --  ALKPHOS 96 --  BILITOT 0.7 --  BILIDIR -- --  IBILI -- --  LIPASE -- 16   PT/INR No results found for this basename: LABPROT:2,INR:2 in the last 72 hours ABG No results found for this basename: PHART:2,PCO2:2,PO2:2,HCO3:2 in the last 72 hours  Studies/Results: Ct Abdomen Pelvis Wo Contrast  06/01/2011  *RADIOLOGY REPORT*  Clinical Data: Diffuse abdominal pain and nausea.  Renal insufficiency.  CT ABDOMEN AND PELVIS WITHOUT CONTRAST  Technique:  Multidetector CT imaging of  the abdomen and pelvis was performed following the standard protocol without intravenous contrast.  Comparison: Abdominal radiograph performed earlier today at 08:36 p.m.  Findings: The visualized lung bases are clear.  Calcification is partially imaged at the aortic valve.  The liver and spleen are unremarkable in appearance.  A small vascular calcification and a clip are noted at the splenic hilum. The gallbladder is within normal limits.  The pancreas is unremarkable in appearance.  Mild nodularity at the left adrenal gland appears to reflect a small 1.2 cm low attenuation adrenal adenoma.  The right adrenal gland is unremarkable in appearance.  Nonspecific perinephric stranding is noted bilaterally.  A right- sided extrarenal pelvis is seen.  There is mild bilateral renal atrophy.  No renal or ureteral stones are seen.  There is no evidence of hydronephrosis.  No free fluid is identified.  The small bowel is unremarkable in appearance.  The stomach is grossly unremarkable; scattered clips are noted about the gastric fundus.  No acute vascular abnormalities are seen.  Mild scattered calcification is noted along the abdominal aorta and its branches.  The appendix is not seen; there is no evidence for appendicitis. Contrast progresses to the level of the transverse colon.  There is focal soft tissue inflammation and trace fluid noted about the  distal sigmoid colon, with associate colonic wall thickening and inflamed diverticula.  There is a small focus of air that may be outside the colon, raising question for microperforation. Findings are compatible with acute diverticulitis.  There is no evidence of abscess formation.  Diverticulosis involves the distal descending and sigmoid colon.  The bladder is mildly distended and grossly unremarkable in appearance.  The patient is status post hysterectomy; no suspicious adnexal masses are seen.  No inguinal lymphadenopathy is seen.  No acute osseous abnormalities are  identified.  There is mild grade 1 anterolisthesis of L4 on L5, reflecting facet disease.  Disc space narrowing is noted at L5-S1.  IMPRESSION:  1.  Acute diverticulitis involving the distal sigmoid colon, with chronic wall thickening, focal soft tissue inflammation, and trace fluid.  Small focus of air adjacent to the colon raises question for microperforation, though it could simply reflect a diverticulum.  No evidence of abscess formation. 2.  Diverticulosis involves the distal descending and sigmoid colon. 3.  Mild bilateral renal atrophy noted. 4.  Mild scattered calcification along the abdominal aorta and its branches. 5.  Small 1.2 cm left adrenal adenoma noted. 6.  Mild degenerative change at the lower lumbar spine.  Original Report Authenticated By: Tonia Ghent, M.D.   Dg Abd Acute W/chest  06/01/2011  *RADIOLOGY REPORT*  Clinical Data: Abdominal pain and nausea/vomiting for 2 days.  ACUTE ABDOMEN SERIES (ABDOMEN 2 VIEW & CHEST 1 VIEW)  Comparison: Chest x-ray from 04/20/2010.  Findings: Cardiac enlargement with calcified tortuous aorta.  Small right effusion.  Mild vascular congestion without overt failure or infiltrates.  No free air.  Scattered air-fluid levels appear nonobstructive.  Hyperdense but normal in size small bowel loops reflect oral contrast for upcoming CT.  No visible abnormal mild vascular calcification.  No definite ureteral calculi.  Moderate stool burden.  Demineralized bones.  IMPRESSION: Cardiomegaly with mild vascular congestion.  No active infiltrates or failure.  No evidence for bowel obstruction or free air.  Original Report Authenticated By: Elsie Stain, M.D.   Dg Abd Acute W/chest  06/01/2011  *RADIOLOGY REPORT*  Clinical Data: Hyperglycemia, nausea, vomiting, diabetes  ACUTE ABDOMEN SERIES (ABDOMEN 2 VIEW & CHEST 1 VIEW)  Comparison: 04/20/2010, 01/10/2007  Findings: Stable cardiomegaly without CHF, pneumonia, collapse, consolidation, effusion or pneumothorax.   Trachea midline.  No free air.  Nonobstructive bowel gas pattern.  No significant dilatation or ileus.  Pelvic calcifications likely venous phleboliths.  No acute osseous finding.  IMPRESSION: No acute findings in the chest or abdomen by plain radiography.  Original Report Authenticated By: Judie Petit. Ruel Favors, M.D.    Assessment: Principal Problem:  *Diverticulitis Active Problems:  HYPERTENSION  Hypotension  Acute renal failure  CKD (chronic kidney disease)  UTI (lower urinary tract infection)     Plan: Continue clear liquid diet and bowel rest.  No miralax. Continue IV abx Agree with medicine team for treatment/management of other medical conditions. Will order Pt eval. Pt states she is not very ambulatory at home.  LOS: 2 days    Marianna Fuss 06/03/2011

## 2011-06-04 LAB — DIFFERENTIAL
Eosinophils Absolute: 0.3 10*3/uL (ref 0.0–0.7)
Lymphocytes Relative: 9 % — ABNORMAL LOW (ref 12–46)
Lymphs Abs: 1 10*3/uL (ref 0.7–4.0)
Neutrophils Relative %: 82 % — ABNORMAL HIGH (ref 43–77)

## 2011-06-04 LAB — BASIC METABOLIC PANEL
Calcium: 8.4 mg/dL (ref 8.4–10.5)
GFR calc non Af Amer: 20 mL/min — ABNORMAL LOW (ref >90)
Sodium: 139 mEq/L (ref 135–145)

## 2011-06-04 LAB — CBC
Platelets: 181 10*3/uL (ref 150–400)
RBC: 2.96 MIL/uL — ABNORMAL LOW (ref 3.87–5.11)
WBC: 11.1 10*3/uL — ABNORMAL HIGH (ref 4.0–10.5)

## 2011-06-04 MED ORDER — POTASSIUM CHLORIDE CRYS ER 20 MEQ PO TBCR
40.0000 meq | EXTENDED_RELEASE_TABLET | ORAL | Status: AC
  Administered 2011-06-04 (×3): 40 meq via ORAL
  Filled 2011-06-04 (×3): qty 2

## 2011-06-04 MED ORDER — ENOXAPARIN SODIUM 30 MG/0.3ML ~~LOC~~ SOLN
30.0000 mg | SUBCUTANEOUS | Status: DC
  Administered 2011-06-04 – 2011-06-07 (×4): 30 mg via SUBCUTANEOUS
  Filled 2011-06-04 (×5): qty 0.3

## 2011-06-04 MED ORDER — SODIUM CHLORIDE 0.9 % IV SOLN
INTRAVENOUS | Status: AC
  Administered 2011-06-04: 14:00:00 via INTRAVENOUS

## 2011-06-04 NOTE — Progress Notes (Signed)
Physical Therapy Evaluation Patient Details Name: Julie Mclean MRN: [id number] DOB: [birth year] Today's Date: 06/04/2011  Problem List:  Patient Active Problem List  Diagnoses  . MACULAR DEGENERATION  . DEAFNESS  . HYPERTENSION  . AORTIC STENOSIS  . ALLERGIC RHINITIS  . BRONCHITIS  . HEADACHE, CHRONIC  . DYSPNEA  . ANGINA, HX OF  . Hypotension  . Diverticulitis  . Acute renal failure  . CKD (chronic kidney disease)  . UTI (lower urinary tract infection)  . Transaminitis    Past Medical History:  Past Medical History  Diagnosis Date  . CHF (congestive heart failure)   . Hypertension   . Depression   . Hypothyroidism   . Chronic back pain   . Headache   . Bronchitis   . Heart valve disease    Past Surgical History:  Past Surgical History  Procedure Date  . Tonsillectomy   . Abdominal hysterectomy   . Hernia repair     PT Assessment/Plan/Recommendation PT Assessment Clinical Impression Statement: Pt admitted for diverticulits.  Presents with no acute PT needs.  PT Recommendation/Assessment: Patent does not need any further PT services No Skilled PT: Patient is modified independent with all activity/mobility PT Recommendation Follow Up Recommendations: No PT follow up Equipment Recommended: None recommended by PT PT Goals  Acute Rehab PT Goals PT Goal Formulation: With patient  PT Evaluation Precautions/Restrictions  Restrictions Weight Bearing Restrictions: No Prior Functioning  Home Living Lives With: Spouse Receives Help From: Family Type of Home: Apartment Home Layout: One level Home Access: Level entry Bathroom Shower/Tub: Tub/shower unit;Curtain Bathroom Toilet: Standard Bathroom Accessibility: No Home Adaptive Equipment: Straight cane;Walker - rolling Prior Function Level of Independence: Independent with basic ADLs;Independent with gait;Requires assistive device for independence;Needs assistance with tranfers (assist for tub transfer.   Ambulate with cane) Able to Take Stairs?: Yes Driving: Yes Vocation: Retired Leisure: Hobbies-no Cognition Cognition Arousal/Alertness: Awake/alert Overall Cognitive Status: Appears within functional limits for tasks assessed Sensation/Coordination Sensation Light Touch: Appears Intact Stereognosis: Not tested Hot/Cold: Not tested Proprioception: Appears Intact Coordination Gross Motor Movements are Fluid and Coordinated: Yes Fine Motor Movements are Fluid and Coordinated: Not tested Extremity Assessment RUE Assessment RUE Assessment: Not tested LUE Assessment LUE Assessment: Not tested RLE Assessment RLE Assessment: Within Functional Limits LLE Assessment LLE Assessment: Within Functional Limits Mobility (including Balance) Bed Mobility Bed Mobility: Yes Supine to Sit: 7: Independent;HOB flat Sit to Supine: 7: Independent;HOB flat Transfers Transfers: Yes Sit to Stand: 7: Independent Stand to Sit: 7: Independent;Without upper extremity assist;To bed Ambulation/Gait Ambulation/Gait: Yes Ambulation/Gait Assistance: 6: Modified independent (Device/Increase time) Ambulation Distance (Feet): 150 Feet Assistive device: 1 person hand held assist (HHA to simulate spc) Gait Pattern: Within Functional Limits Stairs: No Wheelchair Mobility Wheelchair Mobility: No  Posture/Postural Control Posture/Postural Control: No significant limitations Balance Balance Assessed: No Exercise    End of Session PT - End of Session Equipment Utilized During Treatment: Gait belt Activity Tolerance: Patient tolerated treatment well Patient left: in chair;with call bell in reach Nurse Communication: Mobility status for transfers;Mobility status for ambulation General Behavior During Session: [facility] for tasks performed Cognition: [facility] for tasks performed  Deane Melick 06/04/2011, 2:53 PM Exavior Kimmons L. Correna Meacham DPT [phone]

## 2011-06-04 NOTE — Progress Notes (Signed)
Subjective: Abdominal pain is much improved. She has no current complaints and appears to be in good spirits.  Objective: Vital signs in last 24 hours: Temp:  [97.8 F (36.6 C)-99.1 F (37.3 C)] 97.8 F (36.6 C) (01/19 0457) Pulse Rate:  [70-77] 72  (01/19 0457) Resp:  [14-18] 18  (01/19 0457) BP: (101-111)/(39-47) 111/47 mmHg (01/19 0457) SpO2:  [95 %-97 %] 95 % (01/19 0457) Weight change:  Last BM Date: 06/04/11  Intake/Output from previous day: 01/18 0701 - 01/19 0700 In: 220 [P.O.:120; IV Piggyback:100] Out: 800 [Urine:800] Total I/O In: 360 [P.O.:360] Out: 100 [Urine:100]   Physical Exam: General: Alert, awake, oriented x3, in no acute distress. HEENT: No bruits, no goiter. Heart: Regular rate and rhythm, without murmurs, rubs, gallops. Lungs: Clear to auscultation bilaterally. Abdomen: Soft, nontender, nondistended, positive bowel sounds. Extremities: No clubbing cyanosis or edema with positive pedal pulses. Neuro: Grossly intact, nonfocal.    Lab Results: Basic Metabolic Panel:  Basename 06/04/11 0648 06/02/11 0610  NA 139 139  K 2.7* 4.1  CL 101 103  CO2 29 27  GLUCOSE 95 144*  BUN 32* 35*  CREATININE 2.18* 1.89*  CALCIUM 8.4 8.9  MG -- 2.4  PHOS -- 3.3   Liver Function Tests:  [facility] 06/02/11 0610 06/01/11 1817  AST 44* 33  ALT 49* 33  ALKPHOS 96 119*  BILITOT 0.7 0.9  PROT 6.0 7.4  ALBUMIN 2.9* 3.8    Basename 06/01/11 1817  LIPASE 16  AMYLASE --   CBC:  Basename 06/04/11 0648 06/02/11 0610 06/01/11 1817  WBC 11.1* 17.3* --  NEUTROABS 9.1* -- 16.2*  HGB 8.7* 9.6* --  HCT 26.7* 29.3* --  MCV 90.2 88.8 --  PLT 181 189 --   Cardiac Enzymes:  Basename 06/02/11 1143 06/02/11 0610 06/02/11 0149  CKTOTAL 60 42 39  CKMB 2.1 1.6 1.3  CKMBINDEX -- -- --  TROPONINI <0.30 <0.30 <0.30   Thyroid Function Tests:  Basename 06/02/11 0610  TSH 0.497  T4TOTAL --  FREET4 --  T3FREE --  THYROIDAB --    Recent Results (from the past  240 hour(s))  URINE CULTURE     Status: Normal   Collection Time   06/01/11  6:38 PM      Component Value Range Status Comment   Specimen Description URINE, CLEAN CATCH   Final    Special Requests NONE   Final    Setup Time [id number]   Final    Colony Count NO GROWTH   Final    Culture NO GROWTH   Final    Report Status 06/02/2011 FINAL   Final   CULTURE, BLOOD (ROUTINE X 2)     Status: Normal (Preliminary result)   Collection Time   06/02/11 12:50 AM      Component Value Range Status Comment   Specimen Description BLOOD LEFT ARM   Final    Special Requests     Final    Value: BOTTLES DRAWN AEROBIC AND ANAEROBIC 10CC EACH PATIENT ON FOLLOWING ROCEPHIN CIPRO FLAGYL   Setup Time [id number]   Final    Culture     Final    Value:        BLOOD CULTURE RECEIVED NO GROWTH TO DATE CULTURE WILL BE HELD FOR 5 DAYS BEFORE ISSUING A FINAL NEGATIVE REPORT   Report Status PENDING   Incomplete   CULTURE, BLOOD (ROUTINE X 2)     Status: Normal (Preliminary result)   Collection Time  06/02/11  1:00 AM      Component Value Range Status Comment   Specimen Description BLOOD LEFT HAND   Final    Special Requests     Final    Value: BOTTLES DRAWN AEROBIC ONLY 10CC PATIENT ON FOLLOWING ROCEPHIN CIPRO FLAGYL   Setup Time [id number]   Final    Culture     Final    Value:        BLOOD CULTURE RECEIVED NO GROWTH TO DATE CULTURE WILL BE HELD FOR 5 DAYS BEFORE ISSUING A FINAL NEGATIVE REPORT   Report Status PENDING   Incomplete   MRSA PCR SCREENING     Status: Normal   Collection Time   06/02/11  2:09 AM      Component Value Range Status Comment   MRSA by PCR NEGATIVE  NEGATIVE  Final     Studies/Results: No results found.  Medications: Scheduled Meds:   . aspirin  81 mg Oral Daily  . enoxaparin (LOVENOX) injection  40 mg Subcutaneous Q24H  . FLUoxetine  40 mg Oral Daily  . gabapentin  100 mg Oral BID  . morphine  15 mg Oral QID  . piperacillin-tazobactam (ZOSYN)  IV  3.375 g Intravenous  Q8H  . potassium chloride  40 mEq Oral Q4H  . temazepam  15 mg Oral QHS  . DISCONTD: morphine  15 mg Oral QID  . DISCONTD: morphine  15 mg Oral QID   Continuous Infusions:   . sodium chloride     PRN Meds:.acetaminophen, acetaminophen, albuterol, morphine, ondansetron (ZOFRAN) IV, ondansetron, zolpidem  Assessment/Plan:  Principal Problem:  *Diverticulitis Active Problems:  HYPERTENSION  Hypotension  Acute renal failure  CKD (chronic kidney disease)  UTI (lower urinary tract infection)  Transaminitis   #1 diverticulitis with microperforation: Clinically seems to be improving. Is on a full liquid diet, surgery is planning on advancing her diet in the morning. Has had 2 bowel movements today. Continue Zosyn for today. Will consider transitioning to by mouth Cipro Flagyl once she is on a regular diet.  #2 acute on chronic kidney disease: It appears that her baseline creatinine is between 1.4 and 1.6. Overnight she has had an increase from 1.8-2.18. We'll give some IV fluids overnight and assess. She does not appear to be on any nephrotoxic drugs at the moment.   LOS: 3 days   HERNANDEZ ACOSTA,ESTELA [facility] Pager: [phone] 06/04/2011, 1:25 PM

## 2011-06-04 NOTE — Progress Notes (Signed)
PT/OT DISCHARGE NOTE:  PT evaluation completed.  Pt is performing at/near baseline level of function and presents with no further acute PT needs.    Acute PT signing off.  Discussed plan with pt/family and they agree.  Julie Mclean L. [name] DPT [phone] 06/04/2011

## 2011-06-04 NOTE — Progress Notes (Signed)
Pt noted to have some ARF with CrCl<30.  Decreased Lovenox to 30mg  sq daily.  Will continue to monitor renal function. Junita Push, PharmD, BCPS

## 2011-06-04 NOTE — Progress Notes (Signed)
Feels better. No n/v. +bm  Alert, nad Soft, much less TTP  Adv to fulls ? Change ABX b/c of elevated Cr - will defer to medicine team - if need to switch rec iv cipro/flagyl  Tyria Sella. Andrey Campanile, MD, FACS General, Bariatric, & Minimally Invasive Surgery [facility], [place]

## 2011-06-04 NOTE — Progress Notes (Signed)
  Subjective: Pt feeling a lot better. No N/V. No further BM, but passing flatus. She is c/o of decreased urine output and states she usually takes Lasix. UOP 800cc yetserday Objective: Vital signs in last 24 hours: Temp:  [97.8 F (36.6 C)-99.1 F (37.3 C)] 97.8 F (36.6 C) (01/19 0457) Pulse Rate:  [70-77] 72  (01/19 0457) Resp:  [14-18] 18  (01/19 0457) BP: (101-111)/(39-47) 111/47 mmHg (01/19 0457) SpO2:  [95 %-97 %] 95 % (01/19 0457) Last BM Date: 06/02/11  Intake/Output this shift:    Physical Exam: BP 111/47  Pulse 72  Temp(Src) 97.8 F (36.6 C) (Oral)  Resp 18  Ht 5\' 4"  (1.626 m)  Wt 83.2 kg (183 lb 6.8 oz)  BMI 31.48 kg/m2  SpO2 95% Abdomen: softer, almost no tenderness in LLQ.   Labs: CBC  Basename 06/04/11 0648 06/02/11 0610  WBC 11.1* 17.3*  HGB 8.7* 9.6*  HCT 26.7* 29.3*  PLT 181 189   BMET  Basename 06/04/11 0648 06/02/11 0610  NA 139 139  K 2.7* 4.1  CL 101 103  CO2 29 27  GLUCOSE 95 144*  BUN 32* 35*  CREATININE 2.18* 1.89*  CALCIUM 8.4 8.9   LFT  Basename 06/02/11 0610 06/01/11 1817  PROT 6.0 --  ALBUMIN 2.9* --  AST 44* --  ALT 49* --  ALKPHOS 96 --  BILITOT 0.7 --  BILIDIR -- --  IBILI -- --  LIPASE -- 16   PT/INR No results found for this basename: LABPROT:2,INR:2 in the last 72 hours ABG No results found for this basename: PHART:2,PCO2:2,PO2:2,HCO3:2 in the last 72 hours  Studies/Results: No results found.  Assessment: Principal Problem:  *Diverticulitis Active Problems:  HYPERTENSION  Hypotension  Acute renal failure  CKD (chronic kidney disease)  UTI (lower urinary tract infection)  Transaminitis HypoK    Plan: Advance to fulls diet. If no recurrence of pain and WBC normalizes tomorrow, will advance to reg diet and change to po abx. Replete K OOB/Ambulate  LOS: 3 days    Marianna Fuss 06/04/2011

## 2011-06-05 LAB — CBC
Hemoglobin: 8.6 g/dL — ABNORMAL LOW (ref 12.0–15.0)
Platelets: 192 10*3/uL (ref 150–400)
RBC: 2.95 MIL/uL — ABNORMAL LOW (ref 3.87–5.11)

## 2011-06-05 LAB — BASIC METABOLIC PANEL
CO2: 26 mEq/L (ref 19–32)
GFR calc non Af Amer: 21 mL/min — ABNORMAL LOW (ref >90)
Glucose, Bld: 106 mg/dL — ABNORMAL HIGH (ref 70–99)
Potassium: 5.3 mEq/L — ABNORMAL HIGH (ref 3.5–5.1)
Sodium: 141 mEq/L (ref 135–145)

## 2011-06-05 MED ORDER — SODIUM CHLORIDE 0.9 % IV SOLN
INTRAVENOUS | Status: DC
  Administered 2011-06-05: 16:00:00 via INTRAVENOUS

## 2011-06-05 MED ORDER — AMOXICILLIN-POT CLAVULANATE 875-125 MG PO TABS
1.0000 | ORAL_TABLET | Freq: Two times a day (BID) | ORAL | Status: DC
  Administered 2011-06-05 (×2): 1 via ORAL
  Filled 2011-06-05 (×4): qty 1

## 2011-06-05 MED ORDER — METRONIDAZOLE 500 MG PO TABS
500.0000 mg | ORAL_TABLET | Freq: Three times a day (TID) | ORAL | Status: DC
  Administered 2011-06-05 – 2011-06-08 (×10): 500 mg via ORAL
  Filled 2011-06-05 (×13): qty 1

## 2011-06-05 NOTE — Progress Notes (Signed)
  Subjective: Pt feels better. Has had several BMs now. Loose. No new pain or N/V Has been OOB some.  Voiding well.  Objective: Vital signs in last 24 hours: Temp:  [98.4 F (36.9 C)-98.7 F (37.1 C)] 98.4 F (36.9 C) (01/20 0540) Pulse Rate:  [72-80] 74  (01/20 0540) Resp:  [18] 18  (01/20 0540) BP: (110-131)/(46-55) 131/55 mmHg (01/20 0540) SpO2:  [93 %-97 %] 96 % (01/20 0540) Last BM Date: 06/04/11  Intake/Output this shift:    Physical Exam: BP 131/55  Pulse 74  Temp(Src) 98.4 F (36.9 C) (Oral)  Resp 18  Ht 5\' 4"  (1.626 m)  Wt 83.2 kg (183 lb 6.8 oz)  BMI 31.48 kg/m2  SpO2 96% Abdomen: soft, ND, NT  Labs: CBC  Basename 06/05/11 0703 06/04/11 0648  WBC 8.4 11.1*  HGB 8.6* 8.7*  HCT 26.7* 26.7*  PLT 192 181   BMET  Basename 06/05/11 0703 06/04/11 0648  NA 141 139  K 5.3* 2.7*  CL 110 101  CO2 26 29  GLUCOSE 106* 95  BUN 28* 32*  CREATININE 2.09* 2.18*  CALCIUM 8.7 8.4   LFT No results found for this basename: PROT,ALBUMIN,AST,ALT,ALKPHOS,BILITOT,BILIDIR,IBILI,LIPASE in the last 72 hours PT/INR No results found for this basename: LABPROT:2,INR:2 in the last 72 hours ABG No results found for this basename: PHART:2,PCO2:2,PO2:2,HCO3:2 in the last 72 hours  Studies/Results: No results found.  Assessment: Principal Problem:  *Diverticulitis Active Problems:  HYPERTENSION  Hypotension  Acute renal failure  CKD (chronic kidney disease)  UTI (lower urinary tract infection)  Transaminitis     Plan: Will advance to reg diet Change to po augmentin/flagyl due to cipro allergy Renal function slightly improved.  LOS: 4 days    Marianna Fuss 06/05/2011

## 2011-06-05 NOTE — Progress Notes (Signed)
abd soft, nt, nd.  Adv to solids, can switch to oral abx. Will need a total of 2 weeks of abx. Would NOT give any antidiarrheal med  Julie Mclean. Andrey Campanile, MD, FACS General, Bariatric, & Minimally Invasive Surgery [facility], [place]

## 2011-06-05 NOTE — Progress Notes (Signed)
Subjective: Has had several bowel movements. Has no acute complaints.  Objective: Vital signs in last 24 hours: Temp:  [98.4 F (36.9 C)-98.7 F (37.1 C)] 98.4 F (36.9 C) (01/20 0540) Pulse Rate:  [72-80] 74  (01/20 0540) Resp:  [18] 18  (01/20 0540) BP: (110-131)/(46-55) 131/55 mmHg (01/20 0540) SpO2:  [93 %-97 %] 96 % (01/20 0540) Weight change:  Last BM Date: 06/04/11  Intake/Output from previous day: 01/19 0701 - 01/20 0700 In: 1080 [P.O.:1080] Out: 500 [Urine:500]     Physical Exam: General: Alert, awake, oriented x3, in no acute distress. HEENT: No bruits, no goiter. Heart: Regular rate and rhythm, without murmurs, rubs, gallops. Lungs: Clear to auscultation bilaterally. Abdomen: Soft, nontender, nondistended, positive bowel sounds. Extremities: No clubbing cyanosis or edema with positive pedal pulses. Neuro: Grossly intact, nonfocal.    Lab Results: Basic Metabolic Panel:  Basename 06/05/11 0703 06/04/11 0648  NA 141 139  K 5.3* 2.7*  CL 110 101  CO2 26 29  GLUCOSE 106* 95  BUN 28* 32*  CREATININE 2.09* 2.18*  CALCIUM 8.7 8.4  MG -- --  PHOS -- --   CBC:  Basename 06/05/11 0703 06/04/11 0648  WBC 8.4 11.1*  NEUTROABS -- 9.1*  HGB 8.6* 8.7*  HCT 26.7* 26.7*  MCV 90.5 90.2  PLT 192 181    Recent Results (from the past 240 hour(s))  URINE CULTURE     Status: Normal   Collection Time   06/01/11  6:38 PM      Component Value Range Status Comment   Specimen Description URINE, CLEAN CATCH   Final    Special Requests NONE   Final    Setup Time [id number]   Final    Colony Count NO GROWTH   Final    Culture NO GROWTH   Final    Report Status 06/02/2011 FINAL   Final   CULTURE, BLOOD (ROUTINE X 2)     Status: Normal (Preliminary result)   Collection Time   06/02/11 12:50 AM      Component Value Range Status Comment   Specimen Description BLOOD LEFT ARM   Final    Special Requests     Final    Value: BOTTLES DRAWN AEROBIC AND ANAEROBIC 10CC  EACH PATIENT ON FOLLOWING ROCEPHIN CIPRO FLAGYL   Setup Time [id number]   Final    Culture     Final    Value:        BLOOD CULTURE RECEIVED NO GROWTH TO DATE CULTURE WILL BE HELD FOR 5 DAYS BEFORE ISSUING A FINAL NEGATIVE REPORT   Report Status PENDING   Incomplete   CULTURE, BLOOD (ROUTINE X 2)     Status: Normal (Preliminary result)   Collection Time   06/02/11  1:00 AM      Component Value Range Status Comment   Specimen Description BLOOD LEFT HAND   Final    Special Requests     Final    Value: BOTTLES DRAWN AEROBIC ONLY 10CC PATIENT ON FOLLOWING ROCEPHIN CIPRO FLAGYL   Setup Time [id number]   Final    Culture     Final    Value:        BLOOD CULTURE RECEIVED NO GROWTH TO DATE CULTURE WILL BE HELD FOR 5 DAYS BEFORE ISSUING A FINAL NEGATIVE REPORT   Report Status PENDING   Incomplete   MRSA PCR SCREENING     Status: Normal   Collection Time   06/02/11  2:09 AM  Component Value Range Status Comment   MRSA by PCR NEGATIVE  NEGATIVE  Final     Studies/Results: No results found.  Medications: Scheduled Meds:   . amoxicillin-clavulanate  1 tablet Oral Q12H  . aspirin  81 mg Oral Daily  . enoxaparin (LOVENOX) injection  30 mg Subcutaneous Q24H  . FLUoxetine  40 mg Oral Daily  . gabapentin  100 mg Oral BID  . metroNIDAZOLE  500 mg Oral Q8H  . morphine  15 mg Oral QID  . potassium chloride  40 mEq Oral Q4H  . temazepam  15 mg Oral QHS  . DISCONTD: enoxaparin (LOVENOX) injection  40 mg Subcutaneous Q24H  . DISCONTD: piperacillin-tazobactam (ZOSYN)  IV  3.375 g Intravenous Q8H   Continuous Infusions:   . sodium chloride 75 mL/hr at 06/04/11 1416  . sodium chloride     PRN Meds:.acetaminophen, acetaminophen, albuterol, morphine, ondansetron (ZOFRAN) IV, ondansetron, zolpidem  Assessment/Plan:  Principal Problem:  *Diverticulitis Active Problems:  HYPERTENSION  Hypotension  Acute renal failure  CKD (chronic kidney disease)  UTI (lower urinary tract infection)   Transaminitis   #1 diverticulitis with microperforation: Is clinically improving. Diet has been advanced to solids. We'll discontinue Zosyn and change her over to oral Cipro and Flagyl.  #2 acute on chronic kidney disease: Her baseline creatinine is between 1.4 and 1.6. Her creatinine did improve slightly yesterday from 2.18-2.0. I did give her some IV fluids, I think she may be somewhat volume depleted so will go ahead and start her on saline at 75 cc an hour to see if this helps improve her renal function.  #3 hyperkalemia: This iatrogenic secondary to overcorrection. Her potassium yesterday was 2.7. Hold all potassium supplementation.  #4 disposition: Should be able to DC home once her creatinine has improved.   LOS: 4 days   [facility] Pager: [phone] 06/05/2011, 12:53 PM

## 2011-06-06 LAB — CBC
HCT: 28.3 % — ABNORMAL LOW (ref 36.0–46.0)
Hemoglobin: 8.9 g/dL — ABNORMAL LOW (ref 12.0–15.0)
MCHC: 31.4 g/dL (ref 30.0–36.0)
RBC: 3.14 MIL/uL — ABNORMAL LOW (ref 3.87–5.11)

## 2011-06-06 LAB — BASIC METABOLIC PANEL
BUN: 22 mg/dL (ref 6–23)
Chloride: 110 mEq/L (ref 96–112)
GFR calc Af Amer: 32 mL/min — ABNORMAL LOW (ref >90)
GFR calc non Af Amer: 28 mL/min — ABNORMAL LOW (ref >90)
Glucose, Bld: 113 mg/dL — ABNORMAL HIGH (ref 70–99)
Potassium: 5.2 mEq/L — ABNORMAL HIGH (ref 3.5–5.1)
Sodium: 141 mEq/L (ref 135–145)

## 2011-06-06 MED ORDER — PROMETHAZINE HCL 25 MG PO TABS
12.5000 mg | ORAL_TABLET | Freq: Four times a day (QID) | ORAL | Status: DC | PRN
  Administered 2011-06-06 – 2011-06-07 (×2): 12.5 mg via ORAL
  Filled 2011-06-06 (×2): qty 1

## 2011-06-06 MED ORDER — AMOXICILLIN-POT CLAVULANATE 250-62.5 MG/5ML PO SUSR
500.0000 mg | Freq: Three times a day (TID) | ORAL | Status: DC
  Administered 2011-06-06 – 2011-06-08 (×7): 500 mg via ORAL
  Filled 2011-06-06 (×9): qty 10

## 2011-06-06 NOTE — Progress Notes (Signed)
Spoke with patient and her husband about d/c plans. She plans to return home with spouse who they state is in good health. She ambulates with a cane or with rolling walker. She agrees with PT that she is at her baseline and does not require HHPT.She has a PCP and does not have any issues obtaining her meds. CM will continue to follow, no d/c needs identified.

## 2011-06-06 NOTE — Progress Notes (Signed)
1945 telephone to on call DR.  Pt refused to allow Iv team to restart the IV.  Per pt she is going home tomorrow.  Per Dr on call make pt aware that he would like for her to have it in. Document if she still refuses it.

## 2011-06-06 NOTE — Progress Notes (Signed)
Patient seen and examined.  Having difficulty swallowing antibiotics and some nausea after taking them.  Will try to see if we can get liqud form of abxs.

## 2011-06-06 NOTE — Progress Notes (Signed)
Subjective: Chart reviewed. Patient was switched to oral antibiotics yesterday. Since last night patient had 3-4 small loose BMs and significant nausea but minimal vomiting. She denies abdominal pain. She denies dyspnea.  Objective: Blood pressure 139/48, pulse 79, temperature 99.1 F (37.3 C), temperature source Oral, resp. rate 18, height 5\' 4"  (1.626 m), weight 83.2 kg (183 lb 6.8 oz), SpO2 97.00%.  Intake/Output Summary (Last 24 hours) at 06/06/11 0924 Last data filed at 06/05/11 1830  Gross per 24 hour  Intake    720 ml  Output      0 ml  Net    720 ml    General Exam: Comfortable. Respiratory System: Bilateral few expiratory rhonchi but fair air entry.. No increased work of breathing. Cardiovascular System: First and second heart sounds heard. Regular rate and rhythm. No JVD/murmurs. Gastrointestinal System: Abdomen is non distended, soft and normal bowel sounds heard. Central Nervous System: Alert and oriented. No focal neurological deficits.  Lab Results: Basic Metabolic Panel:  Basename 06/06/11 0605 06/05/11 0703  NA 141 141  K 5.2* 5.3*  CL 110 110  CO2 25 26  GLUCOSE 113* 106*  BUN 22 28*  CREATININE 1.66* 2.09*  CALCIUM 8.7 8.7  MG -- --  PHOS -- --   Liver Function Tests: No results found for this basename: AST:2,ALT:2,ALKPHOS:2,BILITOT:2,PROT:2,ALBUMIN:2 in the last 72 hours No results found for this basename: LIPASE:2,AMYLASE:2 in the last 72 hours No results found for this basename: AMMONIA:2 in the last 72 hours CBC:  Basename 06/06/11 0605 06/05/11 0703 06/04/11 0648  WBC 7.8 8.4 --  NEUTROABS -- -- 9.1*  HGB 8.9* 8.6* --  HCT 28.3* 26.7* --  MCV 90.1 90.5 --  PLT 210 192 --   Cardiac Enzymes: No results found for this basename: CKTOTAL:3,CKMB:3,CKMBINDEX:3,TROPONINI:3 in the last 72 hours BNP: No results found for this basename: PROBNP:3 in the last 72 hours D-Dimer: No results found for this basename: DDIMER:2 in the last 72 hours CBG: No  results found for this basename: GLUCAP:6 in the last 72 hours Hemoglobin A1C: No results found for this basename: HGBA1C in the last 72 hours Fasting Lipid Panel: No results found for this basename: CHOL,HDL,LDLCALC,TRIG,CHOLHDL,LDLDIRECT in the last 72 hours Thyroid Function Tests: No results found for this basename: TSH,T4TOTAL,FREET4,T3FREE,THYROIDAB in the last 72 hours Anemia Panel: No results found for this basename: VITAMINB12,FOLATE,FERRITIN,TIBC,IRON,RETICCTPCT in the last 72 hours Coagulation: No results found for this basename: LABPROT:2,INR:2 in the last 72 hours Urine Drug Screen: Drugs of Abuse  No results found for this basename: labopia,  cocainscrnur,  labbenz,  amphetmu,  thcu,  labbarb    Alcohol Level: No results found for this basename: ETH:2 in the last 72 hours  Micro Results: Recent Results (from the past 240 hour(s))  URINE CULTURE     Status: Normal   Collection Time   06/01/11  6:38 PM      Component Value Range Status Comment   Specimen Description URINE, CLEAN CATCH   Final    Special Requests NONE   Final    Setup Time [id number]   Final    Colony Count NO GROWTH   Final    Culture NO GROWTH   Final    Report Status 06/02/2011 FINAL   Final   CULTURE, BLOOD (ROUTINE X 2)     Status: Normal (Preliminary result)   Collection Time   06/02/11 12:50 AM      Component Value Range Status Comment   Specimen Description BLOOD LEFT  ARM   Final    Special Requests     Final    Value: BOTTLES DRAWN AEROBIC AND ANAEROBIC 10CC EACH PATIENT ON FOLLOWING ROCEPHIN CIPRO FLAGYL   Setup Time [id number]   Final    Culture     Final    Value:        BLOOD CULTURE RECEIVED NO GROWTH TO DATE CULTURE WILL BE HELD FOR 5 DAYS BEFORE ISSUING A FINAL NEGATIVE REPORT   Report Status PENDING   Incomplete   CULTURE, BLOOD (ROUTINE X 2)     Status: Normal (Preliminary result)   Collection Time   06/02/11  1:00 AM      Component Value Range Status Comment   Specimen  Description BLOOD LEFT HAND   Final    Special Requests     Final    Value: BOTTLES DRAWN AEROBIC ONLY 10CC PATIENT ON FOLLOWING ROCEPHIN CIPRO FLAGYL   Setup Time [id number]   Final    Culture     Final    Value:        BLOOD CULTURE RECEIVED NO GROWTH TO DATE CULTURE WILL BE HELD FOR 5 DAYS BEFORE ISSUING A FINAL NEGATIVE REPORT   Report Status PENDING   Incomplete   MRSA PCR SCREENING     Status: Normal   Collection Time   06/02/11  2:09 AM      Component Value Range Status Comment   MRSA by PCR NEGATIVE  NEGATIVE  Final     Studies/Results: Ct Abdomen Pelvis Wo Contrast  06/01/2011  *RADIOLOGY REPORT*  Clinical Data: Diffuse abdominal pain and nausea.  Renal insufficiency.  CT ABDOMEN AND PELVIS WITHOUT CONTRAST  Technique:  Multidetector CT imaging of the abdomen and pelvis was performed following the standard protocol without intravenous contrast.  Comparison: Abdominal radiograph performed earlier today at 08:36 p.m.  Findings: The visualized lung bases are clear.  Calcification is partially imaged at the aortic valve.  The liver and spleen are unremarkable in appearance.  A small vascular calcification and a clip are noted at the splenic hilum. The gallbladder is within normal limits.  The pancreas is unremarkable in appearance.  Mild nodularity at the left adrenal gland appears to reflect a small 1.2 cm low attenuation adrenal adenoma.  The right adrenal gland is unremarkable in appearance.  Nonspecific perinephric stranding is noted bilaterally.  A right- sided extrarenal pelvis is seen.  There is mild bilateral renal atrophy.  No renal or ureteral stones are seen.  There is no evidence of hydronephrosis.  No free fluid is identified.  The small bowel is unremarkable in appearance.  The stomach is grossly unremarkable; scattered clips are noted about the gastric fundus.  No acute vascular abnormalities are seen.  Mild scattered calcification is noted along the abdominal aorta and its  branches.  The appendix is not seen; there is no evidence for appendicitis. Contrast progresses to the level of the transverse colon.  There is focal soft tissue inflammation and trace fluid noted about the distal sigmoid colon, with associate colonic wall thickening and inflamed diverticula.  There is a small focus of air that may be outside the colon, raising question for microperforation. Findings are compatible with acute diverticulitis.  There is no evidence of abscess formation.  Diverticulosis involves the distal descending and sigmoid colon.  The bladder is mildly distended and grossly unremarkable in appearance.  The patient is status post hysterectomy; no suspicious adnexal masses are seen.  No inguinal lymphadenopathy is  seen.  No acute osseous abnormalities are identified.  There is mild grade 1 anterolisthesis of L4 on L5, reflecting facet disease.  Disc space narrowing is noted at L5-S1.  IMPRESSION:  1.  Acute diverticulitis involving the distal sigmoid colon, with chronic wall thickening, focal soft tissue inflammation, and trace fluid.  Small focus of air adjacent to the colon raises question for microperforation, though it could simply reflect a diverticulum.  No evidence of abscess formation. 2.  Diverticulosis involves the distal descending and sigmoid colon. 3.  Mild bilateral renal atrophy noted. 4.  Mild scattered calcification along the abdominal aorta and its branches. 5.  Small 1.2 cm left adrenal adenoma noted. 6.  Mild degenerative change at the lower lumbar spine.  Original Report Authenticated By: Tonia Ghent, M.D.   Dg Abd Acute W/chest  06/01/2011  *RADIOLOGY REPORT*  Clinical Data: Abdominal pain and nausea/vomiting for 2 days.  ACUTE ABDOMEN SERIES (ABDOMEN 2 VIEW & CHEST 1 VIEW)  Comparison: Chest x-ray from 04/20/2010.  Findings: Cardiac enlargement with calcified tortuous aorta.  Small right effusion.  Mild vascular congestion without overt failure or infiltrates.  No free  air.  Scattered air-fluid levels appear nonobstructive.  Hyperdense but normal in size small bowel loops reflect oral contrast for upcoming CT.  No visible abnormal mild vascular calcification.  No definite ureteral calculi.  Moderate stool burden.  Demineralized bones.  IMPRESSION: Cardiomegaly with mild vascular congestion.  No active infiltrates or failure.  No evidence for bowel obstruction or free air.  Original Report Authenticated By: Elsie Stain, M.D.   Dg Abd Acute W/chest  06/01/2011  *RADIOLOGY REPORT*  Clinical Data: Hyperglycemia, nausea, vomiting, diabetes  ACUTE ABDOMEN SERIES (ABDOMEN 2 VIEW & CHEST 1 VIEW)  Comparison: 04/20/2010, 01/10/2007  Findings: Stable cardiomegaly without CHF, pneumonia, collapse, consolidation, effusion or pneumothorax.  Trachea midline.  No free air.  Nonobstructive bowel gas pattern.  No significant dilatation or ileus.  Pelvic calcifications likely venous phleboliths.  No acute osseous finding.  IMPRESSION: No acute findings in the chest or abdomen by plain radiography.  Original Report Authenticated By: Judie Petit. Ruel Favors, M.D.    Medications: Scheduled Meds:    . amoxicillin-clavulanate  1 tablet Oral Q12H  . aspirin  81 mg Oral Daily  . enoxaparin (LOVENOX) injection  30 mg Subcutaneous Q24H  . FLUoxetine  40 mg Oral Daily  . gabapentin  100 mg Oral BID  . metroNIDAZOLE  500 mg Oral Q8H  . morphine  15 mg Oral QID  . temazepam  15 mg Oral QHS  . DISCONTD: piperacillin-tazobactam (ZOSYN)  IV  3.375 g Intravenous Q8H   Continuous Infusions:    . sodium chloride 75 mL/hr at 06/05/11 1621   PRN Meds:.acetaminophen, acetaminophen, albuterol, morphine, ondansetron (ZOFRAN) IV, ondansetron, promethazine, zolpidem  Assessment/Plan: 1. Diverticulitis with microperforation: Clinically improved. Current nausea and diarrhea may be due to oral antibiotics. Continue current Augmentin and oral Flagyl. Monitor with Phenergan. 2. Acute on chronic kidney  disease: Creatinine is probably at baseline. Patient's home Lasix and Cozaar are currently on hold. 3. Mild hyperkalemia: Improved/stable. Monitor BMP. 4. Anemia: Stable 5. Chronic diastolic congestive heart failure: Compensated. Will resume Lasix in a day or 2.  Disposition: Discharge when nausea is better and patient is tolerating diet.    Hubert Derstine 06/06/2011, 9:24 AM

## 2011-06-06 NOTE — Progress Notes (Signed)
  Subjective: Pt IV out last night, refused replacement. She had a bad night of nausea, but no vomiting. Feels puny this am. Hasn;t much due to nausea. We started her on po Augmentin and Flagyl yesterday. She continues to have BMs.  Objective: Vital signs in last 24 hours: Temp:  [98.9 F (37.2 C)-100.1 F (37.8 C)] 99.1 F (37.3 C) (01/21 0514) Pulse Rate:  [79-81] 79  (01/21 0514) Resp:  [18-26] 18  (01/21 0514) BP: (113-139)/(43-57) 139/48 mmHg (01/21 0514) SpO2:  [94 %-97 %] 97 % (01/21 0514) Last BM Date: 06/06/11  Intake/Output this shift:    Physical Exam: BP 139/48  Pulse 79  Temp(Src) 99.1 F (37.3 C) (Oral)  Resp 18  Ht 5\' 4"  (1.626 m)  Wt 83.2 kg (183 lb 6.8 oz)  BMI 31.48 kg/m2  SpO2 97% Abdomen: soft, ND, NT  Labs: CBC  Basename 06/06/11 0605 06/05/11 0703  WBC 7.8 8.4  HGB 8.9* 8.6*  HCT 28.3* 26.7*  PLT 210 192   BMET  Basename 06/06/11 0605 06/05/11 0703  NA 141 141  K 5.2* 5.3*  CL 110 110  CO2 25 26  GLUCOSE 113* 106*  BUN 22 28*  CREATININE 1.66* 2.09*  CALCIUM 8.7 8.7   LFT No results found for this basename: PROT,ALBUMIN,AST,ALT,ALKPHOS,BILITOT,BILIDIR,IBILI,LIPASE in the last 72 hours PT/INR No results found for this basename: LABPROT:2,INR:2 in the last 72 hours ABG No results found for this basename: PHART:2,PCO2:2,PO2:2,HCO3:2 in the last 72 hours  Studies/Results: No results found.  Assessment: Principal Problem:  *Diverticulitis Active Problems:  HYPERTENSION  Hypotension  Acute renal failure  CKD (chronic kidney disease)  UTI (lower urinary tract infection)  Transaminitis     Plan: WBC remains normal. Suspect nausea is related to po abx. Will add phenergan, hopefully, if nausea is better and po abx more tolerable, can DC later today vs tomorrow. Her Cr is essentially back to baseline  LOS: 5 days    Marianna Fuss 06/06/2011

## 2011-06-07 LAB — BASIC METABOLIC PANEL
BUN: 16 mg/dL (ref 6–23)
CO2: 25 mEq/L (ref 19–32)
GFR calc non Af Amer: 30 mL/min — ABNORMAL LOW (ref >90)
Glucose, Bld: 108 mg/dL — ABNORMAL HIGH (ref 70–99)
Potassium: 4.4 mEq/L (ref 3.5–5.1)
Sodium: 139 mEq/L (ref 135–145)

## 2011-06-07 MED ORDER — SACCHAROMYCES BOULARDII 250 MG PO CAPS
250.0000 mg | ORAL_CAPSULE | Freq: Two times a day (BID) | ORAL | Status: DC
  Administered 2011-06-07 – 2011-06-08 (×3): 250 mg via ORAL
  Filled 2011-06-07 (×4): qty 1

## 2011-06-07 MED ORDER — PROMETHAZINE HCL 12.5 MG PO TABS
12.5000 mg | ORAL_TABLET | Freq: Three times a day (TID) | ORAL | Status: DC
  Administered 2011-06-07 – 2011-06-08 (×4): 12.5 mg via ORAL
  Filled 2011-06-07 (×6): qty 1

## 2011-06-07 NOTE — Progress Notes (Signed)
Patient seen and examined.  Agree with PA's note.  

## 2011-06-07 NOTE — Progress Notes (Signed)
Subjective: Had nausea and vomiting this morning following medications but since then seems to have improved and has been able to tolerate some solid and liquid diet with premedication. Denies abdominal pain. Mild diarrhea.  Objective: Blood pressure 125/50, pulse 79, temperature 98.5 F (36.9 C), temperature source Oral, resp. rate 20, height 5\' 4"  (1.626 m), weight 83.2 kg (183 lb 6.8 oz), SpO2 95.00%.  Intake/Output Summary (Last 24 hours) at 06/07/11 1456 Last data filed at 06/07/11 1300  Gross per 24 hour  Intake   1080 ml  Output      0 ml  Net   1080 ml    General Exam: Comfortable. Respiratory System: Bilateral few expiratory rhonchi but fair air entry.. No increased work of breathing. Cardiovascular System: First and second heart sounds heard. Regular rate and rhythm. No JVD/murmurs. Gastrointestinal System: Abdomen is non distended, soft and normal bowel sounds heard. Central Nervous System: Alert and oriented. No focal neurological deficits.  Lab Results: Basic Metabolic Panel:  Basename 06/07/11 0620 06/06/11 0605  NA 139 141  K 4.4 5.2*  CL 107 110  CO2 25 25  GLUCOSE 108* 113*  BUN 16 22  CREATININE 1.55* 1.66*  CALCIUM 9.0 8.7  MG -- --  PHOS -- --   Liver Function Tests: No results found for this basename: AST:2,ALT:2,ALKPHOS:2,BILITOT:2,PROT:2,ALBUMIN:2 in the last 72 hours No results found for this basename: LIPASE:2,AMYLASE:2 in the last 72 hours No results found for this basename: AMMONIA:2 in the last 72 hours CBC:  Basename 06/06/11 0605 06/05/11 0703  WBC 7.8 8.4  NEUTROABS -- --  HGB 8.9* 8.6*  HCT 28.3* 26.7*  MCV 90.1 90.5  PLT 210 192   Cardiac Enzymes: No results found for this basename: CKTOTAL:3,CKMB:3,CKMBINDEX:3,TROPONINI:3 in the last 72 hours BNP: No results found for this basename: PROBNP:3 in the last 72 hours D-Dimer: No results found for this basename: DDIMER:2 in the last 72 hours CBG: No results found for this basename:  GLUCAP:6 in the last 72 hours Hemoglobin A1C: No results found for this basename: HGBA1C in the last 72 hours Fasting Lipid Panel: No results found for this basename: CHOL,HDL,LDLCALC,TRIG,CHOLHDL,LDLDIRECT in the last 72 hours Thyroid Function Tests: No results found for this basename: TSH,T4TOTAL,FREET4,T3FREE,THYROIDAB in the last 72 hours Anemia Panel: No results found for this basename: VITAMINB12,FOLATE,FERRITIN,TIBC,IRON,RETICCTPCT in the last 72 hours Coagulation: No results found for this basename: LABPROT:2,INR:2 in the last 72 hours Urine Drug Screen: Drugs of Abuse  No results found for this basename: labopia,  cocainscrnur,  labbenz,  amphetmu,  thcu,  labbarb    Alcohol Level: No results found for this basename: ETH:2 in the last 72 hours  Micro Results: Recent Results (from the past 240 hour(s))  URINE CULTURE     Status: Normal   Collection Time   06/01/11  6:38 PM      Component Value Range Status Comment   Specimen Description URINE, CLEAN CATCH   Final    Special Requests NONE   Final    Setup Time [id number]   Final    Colony Count NO GROWTH   Final    Culture NO GROWTH   Final    Report Status 06/02/2011 FINAL   Final   CULTURE, BLOOD (ROUTINE X 2)     Status: Normal (Preliminary result)   Collection Time   06/02/11 12:50 AM      Component Value Range Status Comment   Specimen Description BLOOD LEFT ARM   Final    Special  Requests     Final    Value: BOTTLES DRAWN AEROBIC AND ANAEROBIC 10CC EACH PATIENT ON FOLLOWING ROCEPHIN CIPRO FLAGYL   Setup Time [id number]   Final    Culture     Final    Value:        BLOOD CULTURE RECEIVED NO GROWTH TO DATE CULTURE WILL BE HELD FOR 5 DAYS BEFORE ISSUING A FINAL NEGATIVE REPORT   Report Status PENDING   Incomplete   CULTURE, BLOOD (ROUTINE X 2)     Status: Normal (Preliminary result)   Collection Time   06/02/11  1:00 AM      Component Value Range Status Comment   Specimen Description BLOOD LEFT HAND   Final     Special Requests     Final    Value: BOTTLES DRAWN AEROBIC ONLY 10CC PATIENT ON FOLLOWING ROCEPHIN CIPRO FLAGYL   Setup Time [id number]   Final    Culture     Final    Value:        BLOOD CULTURE RECEIVED NO GROWTH TO DATE CULTURE WILL BE HELD FOR 5 DAYS BEFORE ISSUING A FINAL NEGATIVE REPORT   Report Status PENDING   Incomplete   MRSA PCR SCREENING     Status: Normal   Collection Time   06/02/11  2:09 AM      Component Value Range Status Comment   MRSA by PCR NEGATIVE  NEGATIVE  Final     Studies/Results: Ct Abdomen Pelvis Wo Contrast  06/01/2011  *RADIOLOGY REPORT*  Clinical Data: Diffuse abdominal pain and nausea.  Renal insufficiency.  CT ABDOMEN AND PELVIS WITHOUT CONTRAST  Technique:  Multidetector CT imaging of the abdomen and pelvis was performed following the standard protocol without intravenous contrast.  Comparison: Abdominal radiograph performed earlier today at 08:36 p.m.  Findings: The visualized lung bases are clear.  Calcification is partially imaged at the aortic valve.  The liver and spleen are unremarkable in appearance.  A small vascular calcification and a clip are noted at the splenic hilum. The gallbladder is within normal limits.  The pancreas is unremarkable in appearance.  Mild nodularity at the left adrenal gland appears to reflect a small 1.2 cm low attenuation adrenal adenoma.  The right adrenal gland is unremarkable in appearance.  Nonspecific perinephric stranding is noted bilaterally.  A right- sided extrarenal pelvis is seen.  There is mild bilateral renal atrophy.  No renal or ureteral stones are seen.  There is no evidence of hydronephrosis.  No free fluid is identified.  The small bowel is unremarkable in appearance.  The stomach is grossly unremarkable; scattered clips are noted about the gastric fundus.  No acute vascular abnormalities are seen.  Mild scattered calcification is noted along the abdominal aorta and its branches.  The appendix is not seen; there is  no evidence for appendicitis. Contrast progresses to the level of the transverse colon.  There is focal soft tissue inflammation and trace fluid noted about the distal sigmoid colon, with associate colonic wall thickening and inflamed diverticula.  There is a small focus of air that may be outside the colon, raising question for microperforation. Findings are compatible with acute diverticulitis.  There is no evidence of abscess formation.  Diverticulosis involves the distal descending and sigmoid colon.  The bladder is mildly distended and grossly unremarkable in appearance.  The patient is status post hysterectomy; no suspicious adnexal masses are seen.  No inguinal lymphadenopathy is seen.  No acute osseous abnormalities are identified.  There is mild grade 1 anterolisthesis of L4 on L5, reflecting facet disease.  Disc space narrowing is noted at L5-S1.  IMPRESSION:  1.  Acute diverticulitis involving the distal sigmoid colon, with chronic wall thickening, focal soft tissue inflammation, and trace fluid.  Small focus of air adjacent to the colon raises question for microperforation, though it could simply reflect a diverticulum.  No evidence of abscess formation. 2.  Diverticulosis involves the distal descending and sigmoid colon. 3.  Mild bilateral renal atrophy noted. 4.  Mild scattered calcification along the abdominal aorta and its branches. 5.  Small 1.2 cm left adrenal adenoma noted. 6.  Mild degenerative change at the lower lumbar spine.  Original Report Authenticated By: Tonia Ghent, M.D.   Dg Abd Acute W/chest  06/01/2011  *RADIOLOGY REPORT*  Clinical Data: Abdominal pain and nausea/vomiting for 2 days.  ACUTE ABDOMEN SERIES (ABDOMEN 2 VIEW & CHEST 1 VIEW)  Comparison: Chest x-ray from 04/20/2010.  Findings: Cardiac enlargement with calcified tortuous aorta.  Small right effusion.  Mild vascular congestion without overt failure or infiltrates.  No free air.  Scattered air-fluid levels appear  nonobstructive.  Hyperdense but normal in size small bowel loops reflect oral contrast for upcoming CT.  No visible abnormal mild vascular calcification.  No definite ureteral calculi.  Moderate stool burden.  Demineralized bones.  IMPRESSION: Cardiomegaly with mild vascular congestion.  No active infiltrates or failure.  No evidence for bowel obstruction or free air.  Original Report Authenticated By: Elsie Stain, M.D.   Dg Abd Acute W/chest  06/01/2011  *RADIOLOGY REPORT*  Clinical Data: Hyperglycemia, nausea, vomiting, diabetes  ACUTE ABDOMEN SERIES (ABDOMEN 2 VIEW & CHEST 1 VIEW)  Comparison: 04/20/2010, 01/10/2007  Findings: Stable cardiomegaly without CHF, pneumonia, collapse, consolidation, effusion or pneumothorax.  Trachea midline.  No free air.  Nonobstructive bowel gas pattern.  No significant dilatation or ileus.  Pelvic calcifications likely venous phleboliths.  No acute osseous finding.  IMPRESSION: No acute findings in the chest or abdomen by plain radiography.  Original Report Authenticated By: Judie Petit. Ruel Favors, M.D.    Medications: Scheduled Meds:    . amoxicillin-clavulanate  500 mg Oral Q8H  . aspirin  81 mg Oral Daily  . enoxaparin (LOVENOX) injection  30 mg Subcutaneous Q24H  . FLUoxetine  40 mg Oral Daily  . gabapentin  100 mg Oral BID  . metroNIDAZOLE  500 mg Oral Q8H  . morphine  15 mg Oral QID  . promethazine  12.5 mg Oral Q8H  . saccharomyces boulardii  250 mg Oral BID  . temazepam  15 mg Oral QHS   Continuous Infusions:    . sodium chloride 75 mL/hr at 06/05/11 1621   PRN Meds:.acetaminophen, acetaminophen, albuterol, morphine, ondansetron (ZOFRAN) IV, ondansetron, zolpidem, DISCONTD: promethazine  Assessment/Plan: 1. Diverticulitis with microperforation: Clinically improved. Current nausea and diarrhea may be due to oral antibiotics. Continue current Augmentin and oral Flagyl. Improving nausea. Monitor and additional day and if continues to improve and is  stable then possible discharge home tomorrow 2. Acute on chronic kidney disease: Creatinine continues to improve.. Patient's home Lasix and Cozaar are currently on hold and may be resumed on discharge.. 3. Mild hyperkalemia: Resolved 4. Anemia: Stable 5. Chronic diastolic congestive heart failure: Compensated. Will resume Lasix in a day or 2.  Disposition: Possible discharge tomorrow.   Julie Mclean 06/07/2011, 2:56 PM

## 2011-06-07 NOTE — Progress Notes (Signed)
  Subjective: Still c/o some nausea and 1 episode of vomiting after taking her abx. She's also had some loose BMs. Denies new pain.  Objective: Vital signs in last 24 hours: Temp:  [98.3 F (36.8 C)-100.5 F (38.1 C)] 98.3 F (36.8 C) (01/22 0545) Pulse Rate:  [61-72] 65  (01/22 0545) Resp:  [18] 18  (01/22 0545) BP: (113-126)/(32-48) 126/40 mmHg (01/22 0545) SpO2:  [93 %-99 %] 98 % (01/22 0620) Last BM Date: 06/06/11  Intake/Output this shift:    Physical Exam: BP 126/40  Pulse 65  Temp(Src) 98.3 F (36.8 C) (Oral)  Resp 18  Ht 5\' 4"  (1.626 m)  Wt 83.2 kg (183 lb 6.8 oz)  BMI 31.48 kg/m2  SpO2 98% Abdomen: soft, ND, NT  Labs: CBC  Basename 06/06/11 0605 06/05/11 0703  WBC 7.8 8.4  HGB 8.9* 8.6*  HCT 28.3* 26.7*  PLT 210 192   BMET  Basename 06/07/11 0620 06/06/11 0605  NA 139 141  K 4.4 5.2*  CL 107 110  CO2 25 25  GLUCOSE 108* 113*  BUN 16 22  CREATININE 1.55* 1.66*  CALCIUM 9.0 8.7   LFT No results found for this basename: PROT,ALBUMIN,AST,ALT,ALKPHOS,BILITOT,BILIDIR,IBILI,LIPASE in the last 72 hours PT/INR No results found for this basename: LABPROT:2,INR:2 in the last 72 hours ABG No results found for this basename: PHART:2,PCO2:2,PO2:2,HCO3:2 in the last 72 hours  Studies/Results: No results found.  Assessment: Principal Problem:  *Diverticulitis Active Problems:  HYPERTENSION  Hypotension  Acute renal failure  CKD (chronic kidney disease)  UTI (lower urinary tract infection)  Transaminitis     Plan: Will try antiemetic q35minute prior to abx doses. Will also add florastor.  LOS: 6 days    Alyse Low 06/07/2011

## 2011-06-08 LAB — BASIC METABOLIC PANEL
BUN: 12 mg/dL (ref 6–23)
CO2: 26 mEq/L (ref 19–32)
Chloride: 109 mEq/L (ref 96–112)
Creatinine, Ser: 1.34 mg/dL — ABNORMAL HIGH (ref 0.50–1.10)
GFR calc Af Amer: 42 mL/min — ABNORMAL LOW (ref >90)
Glucose, Bld: 119 mg/dL — ABNORMAL HIGH (ref 70–99)
Potassium: 4.3 mEq/L (ref 3.5–5.1)

## 2011-06-08 MED ORDER — FUROSEMIDE 40 MG PO TABS: 40.0000 mg | ORAL_TABLET | Freq: Every day | ORAL | Status: AC

## 2011-06-08 MED ORDER — PROMETHAZINE HCL 12.5 MG PO TABS: 12.5000 mg | ORAL_TABLET | Freq: Three times a day (TID) | ORAL | Status: AC | PRN

## 2011-06-08 MED ORDER — METRONIDAZOLE 500 MG PO TABS: 500.0000 mg | ORAL_TABLET | Freq: Three times a day (TID) | ORAL | Status: AC

## 2011-06-08 MED ORDER — AMOXICILLIN-POT CLAVULANATE 250-62.5 MG/5ML PO SUSR: 500.0000 mg | Freq: Three times a day (TID) | ORAL | Status: AC

## 2011-06-08 MED ORDER — SACCHAROMYCES BOULARDII 250 MG PO CAPS: 250.0000 mg | ORAL_CAPSULE | Freq: Two times a day (BID) | ORAL | Status: AC

## 2011-06-08 NOTE — Discharge Summary (Signed)
Discharge Summary  Julie Mclean MR#: [id number]  DOB:March 18, 1928  Date of Admission: 06/01/2011 Date of Discharge: 06/08/2011  Patient's PCP: Mickie Hillier, MD, MD  Primary cardiologist: Dr. Verdis Prime.  Attending Physician:Nilsa Macht  Consults: General surgery: Dr. Gaynelle Adu  Discharge Diagnoses: Principal Problem:  *Diverticulitis Active Problems:  HYPERTENSION  Hypotension  Acute renal failure  CKD (chronic kidney disease)  UTI (lower urinary tract infection)  Transaminitis  anemia  Brief Admitting History and Physical 78 year old Caucasian female with history of congestive heart failure, aortic stenosis, hypertension, depression, hypothyroidism, diverticulosis by review his colonoscopy and chronic back pain presented with two-day history of abdominal pain, nausea and vomiting. She is hard of hearing. There were no fevers. In the emergency room patient had low-grade temperatures , was hypotensive. She had acute renal failure with creatinine of 2.05 and BUN of 34. Her white blood cell count was 17.7. CT scan of the abdomen showed acute diverticulitis involving the distal sigmoid colon with features suggestive of microperforation.  Discharge Medications Current Discharge Medication List    START taking these medications   Details  amoxicillin-clavulanate (AUGMENTIN) 250-62.5 MG/5ML suspension Take 10 mLs (500 mg total) by mouth every 8 (eight) hours. Qty: 420 mL, Refills: 0    metroNIDAZOLE (FLAGYL) 500 MG tablet Take 1 tablet (500 mg total) by mouth every 8 (eight) hours. Qty: 42 tablet, Refills: 0    promethazine (PHENERGAN) 12.5 MG tablet Take 1 tablet (12.5 mg total) by mouth every 8 (eight) hours as needed for nausea. Qty: 30 tablet, Refills: 0    saccharomyces boulardii (FLORASTOR) 250 MG capsule Take 1 capsule (250 mg total) by mouth 2 (two) times daily. Qty: 30 capsule, Refills: 0      CONTINUE these medications which have CHANGED   Details    furosemide (LASIX) 40 MG tablet Take 1 tablet (40 mg total) by mouth daily.      CONTINUE these medications which have NOT CHANGED   Details  aspirin 81 MG chewable tablet Chew 81 mg by mouth daily.    !! beta carotene w/minerals (OCUVITE) tablet Take 1 tablet by mouth daily.    Cholecalciferol (VITAMIN D PO) Take 1 tablet by mouth daily.     FLUoxetine (PROZAC) 40 MG capsule Take 40 mg by mouth daily.    gabapentin (NEURONTIN) 100 MG capsule Take 100 mg by mouth 2 (two) times daily.     morphine (MSIR) 15 MG tablet Take 15 mg by mouth 4 (four) times daily.     !! Multiple Vitamin (MULITIVITAMIN WITH MINERALS) TABS Take 1 tablet by mouth daily.    simvastatin (ZOCOR) 20 MG tablet Take 20 mg by mouth at bedtime.     temazepam (RESTORIL) 15 MG capsule Take 15 mg by mouth at bedtime.      !! - Potential duplicate medications found. Please discuss with provider.    STOP taking these medications     losartan (COZAAR) 50 MG tablet Comments:  Reason for Stopping:       promethazine (PHENERGAN) 25 MG suppository Comments:  Reason for Stopping:       trimethoprim (TRIMPEX) 100 MG tablet Comments:  Reason for Stopping:          Hospital Course: 1. Acute sigmoid diverticulitis with microperforation: Patient was admitted to the hospital. Her bowels were rested and patient was empirically started on intravenous Zosyn secondary to allergy to Cipro. Surgeon's were consulted and continued to see the patient through the hospitalization. She was managed conservatively. Once  her symptoms were better she was switched to oral Augmentin and ciprofloxacin approximately 2 days ago and her diet was advanced. She had nausea, occasional vomiting and episodes of diarrhea on starting oral antibiotics. Antiemetics were started and her symptoms seem to have improved. She indicates that she does not have any further diarrhea. She has mild nausea but is able to tolerate some food. No vomiting. She denies  abdominal pain. She is eager to go home. Surgeons have cleared her for discharge to complete an additional 2 weeks course of above antibiotics and followup with them. 2. Hypotension: Secondary to dehydration. Her Lasix and Cozaar were held. She was hydrated with IV fluids. Her blood pressures have improved. We'll resume low dose Lasix but continue to hold Cozaar until her appetite is better and this can be resumed when she sees her primary care physician. 3. Acute on chronic kidney disease: Her baseline creatinine is apparently 1.5. Her creatinines have gradually improved. Followup outpatient. 4. Urinary tract infection: Treated although culture showed no growth. 5. Moderate aortic stenosis: 6. History of chronic? Diastolic congestive heart failure: Clinically compensated. Will start low dose Lasix and patient can followup with her primary cardiologist as an outpatient. 7. Hypothyroidism: Patient has not been on oral supplements. This can be addressed as outpatient. 8. Anemia: Stable. Outpatient followup   Day of Discharge BP 143/42  Pulse 68  Temp(Src) 98.4 F (36.9 C) (Oral)  Resp 20  Ht 5\' 4"  (1.626 m)  Wt 83.2 kg (183 lb 6.8 oz)  BMI 31.48 kg/m2  SpO2 98%  General Exam: Comfortable.  Respiratory System: Bilateral few expiratory rhonchi but fair air entry.. No increased work of breathing.  Cardiovascular System: First and second heart sounds heard. Regular rate and rhythm. No JVD/murmurs.  Gastrointestinal System: Abdomen is non distended, soft and normal bowel sounds heard.  Central Nervous System: Alert and oriented. No focal neurological deficits.  Results for orders placed during the hospital encounter of 06/01/11 (from the past 48 hour(s))  BASIC METABOLIC PANEL     Status: Abnormal   Collection Time   06/07/11  6:20 AM      Component Value Range Comment   Sodium 139  135 - 145 (mEq/L)    Potassium 4.4  3.5 - 5.1 (mEq/L)    Chloride 107  96 - 112 (mEq/L)    CO2 25  19 - 32  (mEq/L)    Glucose, Bld 108 (*) 70 - 99 (mg/dL)    BUN 16  6 - 23 (mg/dL)    Creatinine, Ser 4.54 (*) 0.50 - 1.10 (mg/dL)    Calcium 9.0  8.4 - 10.5 (mg/dL)    GFR calc non Af Amer 30 (*) >90 (mL/min)    GFR calc Af Amer 35 (*) >90 (mL/min)   BASIC METABOLIC PANEL     Status: Abnormal   Collection Time   06/08/11  5:11 AM      Component Value Range Comment   Sodium 142  135 - 145 (mEq/L)    Potassium 4.3  3.5 - 5.1 (mEq/L)    Chloride 109  96 - 112 (mEq/L)    CO2 26  19 - 32 (mEq/L)    Glucose, Bld 119 (*) 70 - 99 (mg/dL)    BUN 12  6 - 23 (mg/dL)    Creatinine, Ser 0.98 (*) 0.50 - 1.10 (mg/dL)    Calcium 8.5  8.4 - 10.5 (mg/dL)    GFR calc non Af Amer 36 (*) >90 (mL/min)  GFR calc Af Amer 42 (*) >90 (mL/min)     Lab data: 1. CBC showed hemoglobin of 8.9, hematocrit 28.3. 2. TSH 0.497 3. CBG ranging between 95-144 mg/dL 4. Urinalysis showed moderate leukocytes, 7-10 white blood cells and rare bacteria. 5. Blood cultures x2 and urine culture were negative   Ct Abdomen Pelvis Wo Contrast  06/01/2011  *RADIOLOGY REPORT*  Clinical Data: Diffuse abdominal pain and nausea.  Renal insufficiency.  CT ABDOMEN AND PELVIS WITHOUT CONTRAST  Technique:  Multidetector CT imaging of the abdomen and pelvis was performed following the standard protocol without intravenous contrast.  Comparison: Abdominal radiograph performed earlier today at 08:36 p.m.  Findings: The visualized lung bases are clear.  Calcification is partially imaged at the aortic valve.  The liver and spleen are unremarkable in appearance.  A small vascular calcification and a clip are noted at the splenic hilum. The gallbladder is within normal limits.  The pancreas is unremarkable in appearance.  Mild nodularity at the left adrenal gland appears to reflect a small 1.2 cm low attenuation adrenal adenoma.  The right adrenal gland is unremarkable in appearance.  Nonspecific perinephric stranding is noted bilaterally.  A right- sided  extrarenal pelvis is seen.  There is mild bilateral renal atrophy.  No renal or ureteral stones are seen.  There is no evidence of hydronephrosis.  No free fluid is identified.  The small bowel is unremarkable in appearance.  The stomach is grossly unremarkable; scattered clips are noted about the gastric fundus.  No acute vascular abnormalities are seen.  Mild scattered calcification is noted along the abdominal aorta and its branches.  The appendix is not seen; there is no evidence for appendicitis. Contrast progresses to the level of the transverse colon.  There is focal soft tissue inflammation and trace fluid noted about the distal sigmoid colon, with associate colonic wall thickening and inflamed diverticula.  There is a small focus of air that may be outside the colon, raising question for microperforation. Findings are compatible with acute diverticulitis.  There is no evidence of abscess formation.  Diverticulosis involves the distal descending and sigmoid colon.  The bladder is mildly distended and grossly unremarkable in appearance.  The patient is status post hysterectomy; no suspicious adnexal masses are seen.  No inguinal lymphadenopathy is seen.  No acute osseous abnormalities are identified.  There is mild grade 1 anterolisthesis of L4 on L5, reflecting facet disease.  Disc space narrowing is noted at L5-S1.  IMPRESSION:  1.  Acute diverticulitis involving the distal sigmoid colon, with chronic wall thickening, focal soft tissue inflammation, and trace fluid.  Small focus of air adjacent to the colon raises question for microperforation, though it could simply reflect a diverticulum.  No evidence of abscess formation. 2.  Diverticulosis involves the distal descending and sigmoid colon. 3.  Mild bilateral renal atrophy noted. 4.  Mild scattered calcification along the abdominal aorta and its branches. 5.  Small 1.2 cm left adrenal adenoma noted. 6.  Mild degenerative change at the lower lumbar spine.   Original Report Authenticated By: Tonia Ghent, M.D.   Dg Abd Acute W/chest  06/01/2011  *RADIOLOGY REPORT*  Clinical Data: Abdominal pain and nausea/vomiting for 2 days.  ACUTE ABDOMEN SERIES (ABDOMEN 2 VIEW & CHEST 1 VIEW)  Comparison: Chest x-ray from 04/20/2010.  Findings: Cardiac enlargement with calcified tortuous aorta.  Small right effusion.  Mild vascular congestion without overt failure or infiltrates.  No free air.  Scattered air-fluid levels appear nonobstructive.  Hyperdense but normal in size small bowel loops reflect oral contrast for upcoming CT.  No visible abnormal mild vascular calcification.  No definite ureteral calculi.  Moderate stool burden.  Demineralized bones.  IMPRESSION: Cardiomegaly with mild vascular congestion.  No active infiltrates or failure.  No evidence for bowel obstruction or free air.  Original Report Authenticated By: Elsie Stain, M.D.   Dg Abd Acute W/chest  06/01/2011  *RADIOLOGY REPORT*  Clinical Data: Hyperglycemia, nausea, vomiting, diabetes  ACUTE ABDOMEN SERIES (ABDOMEN 2 VIEW & CHEST 1 VIEW)  Comparison: 04/20/2010, 01/10/2007  Findings: Stable cardiomegaly without CHF, pneumonia, collapse, consolidation, effusion or pneumothorax.  Trachea midline.  No free air.  Nonobstructive bowel gas pattern.  No significant dilatation or ileus.  Pelvic calcifications likely venous phleboliths.  No acute osseous finding.  IMPRESSION: No acute findings in the chest or abdomen by plain radiography.  Original Report Authenticated By: Judie Petit. Ruel Favors, M.D.   2-D echocardiogram:  Study Conclusions  - Left ventricle: Wall thickness was increased in a pattern of moderate LVH. Systolic function was normal. The estimated ejection fraction was in the range of 60% to 65%. Wall motion was normal; there were no regional wall motion abnormalities. Features are consistent with a pseudonormal left ventricular filling pattern, with concomitant abnormal relaxation and  increased filling pressure (grade 2 diastolic dysfunction). Doppler parameters are consistent with both elevated ventricular end-diastolic filling pressure and elevated left atrial filling pressure. - Aortic valve: There was moderate stenosis. Moderate regurgitation. Valve area: 1.39cm^2(VTI). Valve area: 1.37cm^2 (Vmax). - Atrial septum: No defect or patent foramen ovale was identified.    Disposition: Discharged home in stable condition.  Diet: Heart healthy  Activity: Increase activity gradually  Follow-up Appts: Discharge Orders    Future Orders Please Complete By Expires   Diet - low sodium heart healthy      Increase activity slowly      Call MD for:  temperature >100.4      Call MD for:  persistant nausea and vomiting      Call MD for:  severe uncontrolled pain      (HEART FAILURE PATIENTS) Call MD:  Anytime you have any of the following symptoms: 1) 3 pound weight gain in 24 hours or 5 pounds in 1 week 2) shortness of breath, with or without a dry hacking cough 3) swelling in the hands, feet or stomach 4) if you have to sleep on extra pillows at night in order to breathe.      Call MD for:  difficulty breathing, headache or visual disturbances         TESTS THAT NEED FOLLOW-UP CBC and BMP in 7-10 days from hospital discharge  Time spent on discharge, talking to the patient, and coordinating care: 45 mins.   SignedMarcellus Scott, MD 06/08/2011, 4:29 PM

## 2011-06-08 NOTE — Progress Notes (Signed)
Subjective: Still having some intermittent nausea with meds but no vomiting.  Wants to go home.  Objective: Vital signs in last 24 hours: Temp:  [98.4 F (36.9 C)-99.2 F (37.3 C)] 98.4 F (36.9 C) (01/23 0541) Pulse Rate:  [68-79] 68  (01/23 0541) Resp:  [20-24] 20  (01/23 0541) BP: (125-143)/(42-50) 143/42 mmHg (01/23 0541) SpO2:  [95 %-98 %] 98 % (01/23 0541) Last BM Date: 06/07/11  Intake/Output from previous day: 01/22 0701 - 01/23 0700 In: 960 [P.O.:960] Out: -  Intake/Output this shift:    PE: Abd-soft, nontender  Lab Results:   [facility] 06/06/11 0605  WBC 7.8  HGB 8.9*  HCT 28.3*  PLT 210   BMET  Basename 06/08/11 0511 06/07/11 0620  NA 142 139  K 4.3 4.4  CL 109 107  CO2 26 25  GLUCOSE 119* 108*  BUN 12 16  CREATININE 1.34* 1.55*  CALCIUM 8.5 9.0   PT/INR No results found for this basename: LABPROT:2,INR:2 in the last 72 hours Comprehensive Metabolic Panel:    Component Value Date/Time   NA 142 06/08/2011 0511   K 4.3 06/08/2011 0511   CL 109 06/08/2011 0511   CO2 26 06/08/2011 0511   BUN 12 06/08/2011 0511   CREATININE 1.34* 06/08/2011 0511   GLUCOSE 119* 06/08/2011 0511   CALCIUM 8.5 06/08/2011 0511   AST 44* 06/02/2011 0610   ALT 49* 06/02/2011 0610   ALKPHOS 96 06/02/2011 0610   BILITOT 0.7 06/02/2011 0610   PROT 6.0 06/02/2011 0610   ALBUMIN 2.9* 06/02/2011 0610     Studies/Results: No results found.  Anti-infectives: Anti-infectives     Start     Dose/Rate Route Frequency Ordered Stop   06/06/11 1400   amoxicillin-clavulanate (AUGMENTIN) 250-62.5 MG/5ML suspension 500 mg        500 mg Oral 3 times per day 06/06/11 1007     06/05/11 1200   metroNIDAZOLE (FLAGYL) tablet 500 mg        500 mg Oral 3 times per day 06/05/11 0950     06/05/11 1100   amoxicillin-clavulanate (AUGMENTIN) 875-125 MG per tablet 1 tablet  Status:  Discontinued        1 tablet Oral Every 12 hours 06/05/11 0950 06/06/11 1007   06/02/11 0200    piperacillin-tazobactam (ZOSYN) IVPB 3.375 g  Status:  Discontinued        3.375 g 12.5 mL/hr over 240 Minutes Intravenous Every 8 hours 06/02/11 0140 06/05/11 0950   06/01/11 2230   ciprofloxacin (CIPRO) IVPB 400 mg  Status:  Discontinued        400 mg 200 mL/hr over 60 Minutes Intravenous  Once 06/01/11 2226 06/01/11 2315   06/01/11 2230   metroNIDAZOLE (FLAGYL) IVPB 500 mg        500 mg 100 mL/hr over 60 Minutes Intravenous  Once 06/01/11 2226 06/02/11 0037   06/01/11 2045   cefTRIAXone (ROCEPHIN) 1 g in dextrose 5 % 50 mL IVPB  Status:  Discontinued        1 g 100 mL/hr over 30 Minutes Intravenous Every 24 hours 06/01/11 2038 06/02/11 0133          Assessment Principal Problem:  *Diverticulitis-resolving with antibiotics Active Problems:  HYPERTENSION  Hypotension  Acute renal failure  CKD (chronic kidney disease)  UTI (lower urinary tract infection)  Transaminitis    LOS: 7 days   Plan: Could go home on 2 more weeks of oral abxs.  Would have her follow up  with her PCP in 2 weeks.  No surgery planned.   Everette Mall J 06/08/2011

## 2011-08-25 ENCOUNTER — Other Ambulatory Visit (HOSPITAL_COMMUNITY): Payer: Self-pay | Admitting: Family Medicine

## 2011-08-25 DIAGNOSIS — Z1231 Encounter for screening mammogram for malignant neoplasm of breast: Secondary | ICD-10-CM

## 2011-09-13 ENCOUNTER — Other Ambulatory Visit: Payer: Self-pay | Admitting: Dermatology

## 2011-09-21 ENCOUNTER — Ambulatory Visit (HOSPITAL_COMMUNITY): Payer: Medicare Other

## 2012-04-05 ENCOUNTER — Other Ambulatory Visit: Payer: Self-pay | Admitting: Nephrology

## 2012-04-05 DIAGNOSIS — N184 Chronic kidney disease, stage 4 (severe): Secondary | ICD-10-CM

## 2012-04-05 DIAGNOSIS — I10 Essential (primary) hypertension: Secondary | ICD-10-CM

## 2012-04-09 ENCOUNTER — Ambulatory Visit
Admission: RE | Admit: 2012-04-09 | Discharge: 2012-04-09 | Disposition: A | Discharge status: Home / Self Care | Payer: Medicare Other | Source: Ambulatory Visit | Attending: Nephrology | Admitting: Nephrology

## 2012-04-09 DIAGNOSIS — N184 Chronic kidney disease, stage 4 (severe): Secondary | ICD-10-CM

## 2012-04-09 DIAGNOSIS — I10 Essential (primary) hypertension: Secondary | ICD-10-CM

## 2012-08-22 ENCOUNTER — Other Ambulatory Visit: Payer: Self-pay | Admitting: Dermatology

## 2013-02-14 ENCOUNTER — Other Ambulatory Visit: Payer: Self-pay | Admitting: Family Medicine

## 2013-02-14 ENCOUNTER — Ambulatory Visit
Admission: RE | Admit: 2013-02-14 | Discharge: 2013-02-14 | Disposition: A | Discharge status: Home / Self Care | Payer: Medicare Other | Source: Ambulatory Visit | Attending: Family Medicine | Admitting: Family Medicine

## 2013-02-14 DIAGNOSIS — R109 Unspecified abdominal pain: Secondary | ICD-10-CM

## 2013-02-14 MED ORDER — IOHEXOL 300 MG/ML  SOLN: 30.0000 mL | Freq: Once | INTRAMUSCULAR | Status: AC | PRN

## 2013-05-29 ENCOUNTER — Ambulatory Visit (INDEPENDENT_AMBULATORY_CARE_PROVIDER_SITE_OTHER): Payer: Medicare Other | Admitting: Interventional Cardiology

## 2013-05-29 ENCOUNTER — Encounter: Payer: Self-pay | Admitting: Interventional Cardiology

## 2013-05-29 VITALS — BP 150/54 | HR 65 | Ht 64.0 in | Wt 160.8 lb

## 2013-05-29 DIAGNOSIS — I350 Nonrheumatic aortic (valve) stenosis: Secondary | ICD-10-CM

## 2013-05-29 DIAGNOSIS — I359 Nonrheumatic aortic valve disorder, unspecified: Secondary | ICD-10-CM

## 2013-05-29 DIAGNOSIS — N189 Chronic kidney disease, unspecified: Secondary | ICD-10-CM

## 2013-05-29 DIAGNOSIS — I10 Essential (primary) hypertension: Secondary | ICD-10-CM

## 2013-05-29 NOTE — Patient Instructions (Signed)
Your physician recommends that you continue on your current medications as directed. Please refer to the Current Medication list given to you today.  Your physician has requested that you have an echocardiogram. Echocardiography is a painless test that uses sound waves to create images of your heart. It provides your doctor with information about the size and shape of your heart and how well your heart's chambers and valves are working. This procedure takes approximately one hour. There are no restrictions for this procedure.  Your physician wants you to follow-up in: 6 months You will receive a reminder letter in the mail two months in advance. If you don't receive a letter, please call our office to schedule the follow-up appointment.  

## 2013-05-29 NOTE — Progress Notes (Signed)
Patient ID: Julie Mclean, female   DOB: 06/09/1927, 80 y.o.   MRN: [id number] Past Medical History  anemia--Fe defic 12/01 (large hiatal hernia); anemia of chron dis (ferritin nl, creat 2.0) 2011; egd/colon neg 08/2008   Hiatal hernia with reflux, status post surgical repair   Depression   Hypothyroidism   Hypertension   Osteoporosis   Macular degeneration   Bilateral hearing loss   Aortic stenosis, moderate to severe with AVA 1.07 cm 2 and moderate insufficiency   osteoarthritis with chronic pain followed by Dr. Murray HodgkinsBartko   Hyperlipidemia   History of colon polyps--solitary diminutive adenoma 4/10    skin cancers followed by Dr Emily FilbertGould, including melanoma in situ removed from the cheek in 2011, history of basal cell carcinoma as well   Chronic diastolic heart failure, since March 2011   chronic kidney disease stage IV followed by nephrologist Dr. [name]      [address] [place], [place]  [address] Phone: [phone] Fax:  [phone]  Date:  05/29/2013   ID:  Julie Mclean, DOB 12/21/1927, MRN [id number]  PCP:  Mickie HillierLITTLE,KEVIN LORNE, MD   ASSESSMENT:  1. Aortic stenosis, not clinically changed unless he exertional fatigue this AMS related  2. Hyperlipidemia  3. History of hypertension, controlled  4. Chronic diastolic heart failure, clinically active but apparently stable.  PLAN:  1. 2-D Doppler echocardiogram 2. Clinical followup in 6 months   SUBJECTIVE: Julie Mclean is a 80 y.o. female who has aortic stenosis previous the documented to be mild to moderate in 2011. She now complains of a increasing exertional fatigue. She denies orthopnea and lower shortly swelling. No episodes of syncope. She denies chest pain.   Wt Readings from Last 3 Encounters:  05/29/13 160 lb 12.8 oz (72.938 kg)  06/02/11 183 lb 6.8 oz (83.2 kg)  01/07/08 185 lb (83.915 kg)     Past Medical History  Diagnosis Date  . CHF (congestive heart failure)   . Hypertension     . Depression   . Hypothyroidism   . Chronic back pain   . Headache(784.0)   . Bronchitis   . Heart valve disease     Current Outpatient Prescriptions  Medication Sig Dispense Refill  . aspirin 81 MG chewable tablet Chew 81 mg by mouth daily.      . calcitRIOL (ROCALTROL) 0.25 MCG capsule 2 days a week      . Cholecalciferol (VITAMIN D PO) Take 1 tablet by mouth daily.       Marland Kitchen. FLUoxetine (PROZAC) 40 MG capsule Take 40 mg by mouth daily.      . furosemide (LASIX) 40 MG tablet Take 1 tablet (40 mg total) by mouth daily.      Marland Kitchen. levothyroxine (SYNTHROID, LEVOTHROID) 25 MCG tablet Take 25 mcg by mouth daily.      Marland Kitchen. losartan (COZAAR) 50 MG tablet Take 50 mg by mouth daily.      Marland Kitchen. morphine (MSIR) 15 MG tablet Take 15 mg by mouth 4 (four) times daily.       . Multiple Vitamin (MULITIVITAMIN WITH MINERALS) TABS Take 1 tablet by mouth daily.      . temazepam (RESTORIL) 15 MG capsule Take 15 mg by mouth at bedtime.        No current facility-administered medications for this visit.    Allergies:    Allergies  Allergen Reactions  . Ciprofloxacin Rash  . Sulfa Antibiotics Rash    Social History:  The patient  reports that she has never smoked. She does not have any smokeless tobacco history on file. She reports that she does not drink alcohol or use illicit drugs.   ROS:  Please see the history of present illness.   Decreased memory. Increased difficulty with ambulation.   All other systems reviewed and negative.   OBJECTIVE: VS:  BP 150/54  Pulse 65  Ht 5\' 4"  (1.626 m)  Wt 160 lb 12.8 oz (72.938 kg)  BMI 27.59 kg/m2 Well nourished, well developed, in no acute distress, appears stated age. HEENT: normal Neck: JVD flat. Carotid bruit bilateral transmitted bruits from aortic valve  Cardiac:  normal S1, S2; RRR; 3/6 crescendo decrescendo systolic murmur at right upper sternal border. No diastolic murmur Lungs:  clear to auscultation bilaterally, no wheezing, rhonchi or rales Abd: soft,  nontender, no hepatomegaly Ext: Edema absent. Pulses 1+ bilateral Skin: warm and dry Neuro:  CNs 2-12 intact, no focal abnormalities noted  EKG:  Prominent voltage but otherwise unremarkable with normal sinus rhythm       Signed, Darci Needle III, MD 05/29/2013 3:22 PM

## 2013-05-30 ENCOUNTER — Encounter: Payer: Self-pay | Admitting: Interventional Cardiology

## 2013-06-18 ENCOUNTER — Ambulatory Visit (HOSPITAL_COMMUNITY): Payer: Medicare Other | Attending: Interventional Cardiology | Admitting: Cardiology

## 2013-06-18 DIAGNOSIS — I059 Rheumatic mitral valve disease, unspecified: Secondary | ICD-10-CM | POA: Insufficient documentation

## 2013-06-18 DIAGNOSIS — I10 Essential (primary) hypertension: Secondary | ICD-10-CM | POA: Insufficient documentation

## 2013-06-18 DIAGNOSIS — I359 Nonrheumatic aortic valve disorder, unspecified: Secondary | ICD-10-CM | POA: Insufficient documentation

## 2013-06-18 DIAGNOSIS — I509 Heart failure, unspecified: Secondary | ICD-10-CM | POA: Insufficient documentation

## 2013-06-18 DIAGNOSIS — I350 Nonrheumatic aortic (valve) stenosis: Secondary | ICD-10-CM

## 2013-06-18 DIAGNOSIS — E785 Hyperlipidemia, unspecified: Secondary | ICD-10-CM | POA: Insufficient documentation

## 2013-06-18 NOTE — Progress Notes (Signed)
Echo performed. 

## 2013-06-24 ENCOUNTER — Telehealth: Payer: Self-pay

## 2013-06-24 DIAGNOSIS — I350 Nonrheumatic aortic (valve) stenosis: Secondary | ICD-10-CM

## 2013-06-24 NOTE — Telephone Encounter (Signed)
Message copied by Jarvis NewcomerPARRIS-GODLEY, Szymon Foiles S on Mon Jun 24, 2013  2:59 PM ------      Message from: Verdis PrimeSMITH, HENRY      Created: Fri Jun 21, 2013 11:31 AM       Moderately severe aortic stenosis. We should continue to follow with periodic echo studies. Mild progression when compared to the prior study ------

## 2013-06-24 NOTE — Telephone Encounter (Signed)
pt husband given results of echo.Moderately severe aortic stenosis. We should continue to follow with periodic echo studies. Mild progression when compared to the prior study.pt husband verbalized understanding.

## 2013-07-08 ENCOUNTER — Telehealth: Payer: Self-pay | Admitting: Interventional Cardiology

## 2013-07-08 NOTE — Telephone Encounter (Signed)
New message  Patient would like results of Echo, please call and advise.

## 2013-07-08 NOTE — Telephone Encounter (Signed)
returned pt call.pt is hard of hearing.pt  and pt husband had previously been given echo results.went over echo results again with pt and pt husband.Moderately severe aortic stenosis. We should continue to follow with periodic echo studies. Mild progression  when compared to the prior study.pt adv on symptoms to look out for sob, chest pain, syncope.pt adv to call the office if she is experiencing symptoms.pt agreeable with plan and verbalized understanding.

## 2013-11-29 ENCOUNTER — Encounter: Payer: Self-pay | Admitting: Interventional Cardiology

## 2013-12-12 ENCOUNTER — Encounter: Payer: Self-pay | Admitting: Interventional Cardiology

## 2013-12-12 ENCOUNTER — Ambulatory Visit (INDEPENDENT_AMBULATORY_CARE_PROVIDER_SITE_OTHER): Payer: Medicare Other | Admitting: Interventional Cardiology

## 2013-12-12 VITALS — BP 120/78 | HR 68 | Ht 64.0 in | Wt 152.0 lb

## 2013-12-12 DIAGNOSIS — N183 Chronic kidney disease, stage 3 unspecified: Secondary | ICD-10-CM

## 2013-12-12 DIAGNOSIS — I10 Essential (primary) hypertension: Secondary | ICD-10-CM

## 2013-12-12 DIAGNOSIS — I359 Nonrheumatic aortic valve disorder, unspecified: Secondary | ICD-10-CM

## 2013-12-12 NOTE — Patient Instructions (Addendum)
Your physician recommends that you continue on your current medications as directed. Please refer to the Current Medication list given to you today.  Your physician has requested that you have an echocardiogram. Echocardiography is a painless test that uses sound waves to create images of your heart. It provides your doctor with information about the size and shape of your heart and how well your heart's chambers and valves are working. This procedure takes approximately one hour. There are no restrictions for this procedure.( To be scheduled in 6 months)  Your physician wants you to follow-up in: 6 months with Dr.Smith You will receive a reminder letter in the mail two months in advance. If you don't receive a letter, please call our office to schedule the follow-up appointment.    .Marland Kitchen

## 2013-12-12 NOTE — Progress Notes (Signed)
Patient ID: Julie Mclean, female   DOB: 10/04/1927, 80 y.o.   MRN: [id number]    [address] [place], [place]  [address] Phone: [phone] Fax:  [phone]) [phone]  Date:  12/12/2013   ID:  Julie Mclean, DOB 10/20/1927, MRN [id number]  PCP:  Mickie HillierLITTLE,KEVIN LORNE, MD   ASSESSMENT:  1. Aortic valve disease with moderately severe aortic stenosis and mild to moderate aortic regurgitation, clinically stable 2. Chronic diastolic heart failure, stable 3. Chronic kidney disease, stage III-IV 4. Systemic arterial hypertension, stable 5. Elderly and frail  PLAN:  1. Continue to monitor for evidence of aortic stenosis progression. She should call if syncope, orthopnea, change in chronic dyspnea on exertion, or angina. 2. 2-D echocardiogram in 6 months along with clinical followup   SUBJECTIVE: Julie Mclean is a 80 y.o. female was accompanied by daughter. The husband did not come the day and is is [name] tells me that he is having memory issues. She denies orthopnea. She does have dyspnea on exertion it is unchanged. She has not noticed swelling. She denies dizziness, lightheadedness, syncope, and angina. She feels exertional tolerance is decreasing. She has chronic kidney disease and is being followed by [facility] [name].   Wt Readings from Last 3 Encounters:  12/12/13 152 lb (68.947 kg)  05/29/13 160 lb 12.8 oz (72.938 kg)  06/02/11 183 lb 6.8 oz (83.2 kg)     Past Medical History  Diagnosis Date  . CHF (congestive heart failure)   . Hypertension   . Depression   . Hypothyroidism   . Chronic back pain   . Headache(784.0)   . Bronchitis   . Heart valve disease     Current Outpatient Prescriptions  Medication Sig Dispense Refill  . aspirin 81 MG chewable tablet Chew 81 mg by mouth daily.      Marland Kitchen. FLUoxetine (PROZAC) 40 MG capsule Take 40 mg by mouth daily.      . furosemide (LASIX) 40 MG tablet Take 1 tablet (40 mg total) by mouth daily.      Marland Kitchen.  levothyroxine (SYNTHROID, LEVOTHROID) 25 MCG tablet Take 25 mcg by mouth daily.      Marland Kitchen. losartan (COZAAR) 50 MG tablet Take 25 mg by mouth daily.       Marland Kitchen. morphine (MSIR) 15 MG tablet Take 15 mg by mouth 4 (four) times daily.       . Multiple Vitamins-Minerals (OCUVITE PO) Take 1 tablet by mouth daily.      . temazepam (RESTORIL) 15 MG capsule Take 15 mg by mouth at bedtime.        No current facility-administered medications for this visit.    Allergies:    Allergies  Allergen Reactions  . Ciprofloxacin Rash  . Sulfa Antibiotics Rash    Social History:  The patient  reports that she has never smoked. She does not have any smokeless tobacco history on file. She reports that she does not drink alcohol or use illicit drugs.   ROS:  Please see the history of present illness.   No blood in her urine or stool. Denies angina. No transient neurological complaints.   All other systems reviewed and negative.   OBJECTIVE: VS:  BP 120/78  Pulse 68  Ht 5\' 4"  (1.626 m)  Wt 152 lb (68.947 kg)  BMI 26.08 kg/m2 Well nourished, well developed, in no acute distress, elderly and frail HEENT: normal Neck: JVD flat. Carotid bruit bilateral transmitted  bruits from the aortic valve  Cardiac:  normal S1, S2; RRR; no murmur Lungs:  clear to auscultation bilaterally, no wheezing, rhonchi or rales Abd: soft, nontender, no hepatomegaly Ext: Edema absent. Pulses 2+ bilateral Skin: warm and dry Neuro:  CNs 2-12 intact, no focal abnormalities noted  EKG:  Not performed       Signed, Darci Needle III, MD 12/12/2013 3:10 PM

## 2014-06-27 ENCOUNTER — Ambulatory Visit (INDEPENDENT_AMBULATORY_CARE_PROVIDER_SITE_OTHER): Payer: Medicare Other | Admitting: Interventional Cardiology

## 2014-06-27 ENCOUNTER — Encounter: Payer: Self-pay | Admitting: Interventional Cardiology

## 2014-06-27 VITALS — BP 122/72 | HR 58 | Ht 64.0 in | Wt 144.0 lb

## 2014-06-27 DIAGNOSIS — I10 Essential (primary) hypertension: Secondary | ICD-10-CM

## 2014-06-27 DIAGNOSIS — I359 Nonrheumatic aortic valve disorder, unspecified: Secondary | ICD-10-CM

## 2014-06-27 DIAGNOSIS — N184 Chronic kidney disease, stage 4 (severe): Secondary | ICD-10-CM

## 2014-06-27 NOTE — Patient Instructions (Signed)
Your physician wants you to follow-up in:  6 months. You will receive a reminder letter in the mail two months in advance. If you don't receive a letter, please call our office to schedule the follow-up appointment.   

## 2014-06-27 NOTE — Progress Notes (Signed)
Patient ID: Julie Mclean, female   DOB: 01-21-1928, 81 y.o.   MRN: [id number]    Cardiology Office Note   Date:  06/27/2014   ID:  Julie Mclean, DOB June 22, 1927, MRN [id number]  PCP:  Mickie Hillier, MD  Cardiologist:   Lesleigh Noe, MD   No chief complaint on file.     History of Present Illness: Julie Mclean is a 81 y.o. female who presents for  Aortic valve disease. She denies syncope, angina, and dyspnea. She has not noticed any evidence of volume overload. No episodes of palpitation or near-syncope. She has chronic mild lower extremity edema. She is followed by nephrology, Dr. Eliott Nine. She doesn't know much about her underlying kidney ailment.    Past Medical History  Diagnosis Date  . CHF (congestive heart failure)   . Hypertension   . Depression   . Hypothyroidism   . Chronic back pain   . Headache(784.0)   . Bronchitis   . Heart valve disease     Past Surgical History  Procedure Laterality Date  . Tonsillectomy    . Abdominal hysterectomy    . Hernia repair       Current Outpatient Prescriptions  Medication Sig Dispense Refill  . aspirin 81 MG chewable tablet Chew 81 mg by mouth daily.    Marland Kitchen FLUoxetine (PROZAC) 40 MG capsule Take 40 mg by mouth daily.    . furosemide (LASIX) 40 MG tablet Take 1 tablet (40 mg total) by mouth daily.    Marland Kitchen levothyroxine (SYNTHROID, LEVOTHROID) 25 MCG tablet Take 25 mcg by mouth daily.    Marland Kitchen losartan (COZAAR) 25 MG tablet Take 25 mg by mouth daily.    Marland Kitchen morphine (MSIR) 15 MG tablet Take 15 mg by mouth 3 (three) times daily with meals.     . temazepam (RESTORIL) 15 MG capsule Take 15 mg by mouth at bedtime.      No current facility-administered medications for this visit.    Allergies:   Ciprofloxacin and Sulfa antibiotics    Social History:  The patient  reports that she has never smoked. She does not have any smokeless tobacco history on file. She reports that she does not drink alcohol or use illicit drugs.   Family  History:  The patient's family history includes Lung cancer in her father; Stroke in her mother.    ROS:  Please see the history of present illness.   Otherwise, review of systems are positive for  Aching in her hips and knees. Decreased energy..   All other systems are reviewed and negative.    PHYSICAL EXAM: VS:  BP 122/72 mmHg  Pulse 58  Ht  (1.626 m)  Wt 144 lb (65.318 kg)  BMI 24.71 kg/m2 , BMI Body mass index is 24.71 kg/(m^2). GEN: Well nourished, well developed, in no acute distress HEENT: normal Neck: no JVD, carotid bruits, or masses Cardiac:  2 to 3/6 systolic murmur of aortic stenosis. 2/6 decrescendo murmur of aortic regurgitation.RRR; no rubs, or gallops,no edema  Respiratory:  clear to auscultation bilaterally, normal work of breathing GI: soft, nontender, nondistended, + BS MS: no deformity or atrophy Skin: warm and dry, no rash Neuro:  Strength and sensation are intact Psych: euthymic mood, full affect   EKG:  EKG is ordered today. The ekg ordered today demonstrates  Sinus bradycardia 58 bpm with prominent voltage. First-degree AV block.. No change compared to prior.   Recent Labs: No results found for requested labs  within last 365 days.    Lipid Panel No results found for: CHOL, TRIG, HDL, CHOLHDL, VLDL, LDLCALC, LDLDIRECT    Wt Readings from Last 3 Encounters:  06/27/14 144 lb (65.318 kg)  12/12/13 152 lb (68.947 kg)  05/29/13 160 lb 12.8 oz (72.938 kg)      Other studies Reviewed: Additional studies/ records that were reviewed today include:  Reviewed laboratory data from Dr. [name]. Review of the above records demonstrates:  Creatinine is running in the 2.2 range based upon laboratory data from Dr. Domenic Schwabonovan in July 2015   ASSESSMENT AND PLAN:  1.   Aortic valve disease with moderate aortic stenosis and aortic regurgitation, clinically stable. No symptoms of heart failure, syncope, or angina.  2. Chronic kidney disease stage III/IV, followed  by Dr. [name]  3. Essential hypertension under excellent control   Current medicines are reviewed at length with the patient today.  The patient does not have concerns regarding medicines.  The following changes have been made:  no change  Labs/ tests ordered today include:  Consider echocardiogram on next office visit  No orders of the defined types were placed in this encounter.     Disposition:   FU with  Mendel RyderH. Rickesha Veracruz in 6 months   Signed, Lesleigh NoeSMITH III,Yocelyn Brocious W, MD  06/27/2014 2:57 PM    [facility] [address], [place], [place]  [address] Phone: [phone](336) [phone]; Fax: [phone]

## 2014-12-30 NOTE — Progress Notes (Addendum)
Cardiology Office Note   Date:  12/31/2014   ID:  ETHAN KASPERSKI, DOB 05/30/1927, MRN [id number]  PCP:  Mickie Hillier, MD  Cardiologist:  Lesleigh Noe, MD   Chief Complaint  Patient presents with  . Cardiac Valve Problem    aortic stenosis      History of Present Illness: Julie Mclean is a 81 y.o. female who presents for calcific aortic valve disease, hypertension, chronic diastolic heart failure, and probable underlying coronary artery disease.  Doing relatively well. She doesn't feel restricted in physical activity. She's become increasingly sedentary and she is age. She denies orthopnea. She has not had syncope. She denies angina/chest pain.  Past Medical History  Diagnosis Date  . CHF (congestive heart failure)   . Hypertension   . Depression   . Hypothyroidism   . Chronic back pain   . Headache(784.0)   . Bronchitis   . Heart valve disease     Past Surgical History  Procedure Laterality Date  . Tonsillectomy    . Abdominal hysterectomy    . Hernia repair       Current Outpatient Prescriptions  Medication Sig Dispense Refill  . aspirin 81 MG chewable tablet Chew 81 mg by mouth daily.    Marland Kitchen FLUoxetine (PROZAC) 40 MG capsule Take 40 mg by mouth daily.    . furosemide (LASIX) 40 MG tablet Take 1 tablet (40 mg total) by mouth daily.    Marland Kitchen levothyroxine (SYNTHROID, LEVOTHROID) 25 MCG tablet Take 25 mcg by mouth daily.    Marland Kitchen losartan (COZAAR) 25 MG tablet Take 25 mg by mouth daily.    Marland Kitchen morphine (MSIR) 15 MG tablet Take 15 mg by mouth 3 (three) times daily with meals.     . Multiple Vitamins-Minerals (PRESERVISION AREDS 2 PO) Take 1 capsule by mouth 2 (two) times daily.    . temazepam (RESTORIL) 15 MG capsule Take 15 mg by mouth at bedtime.      No current facility-administered medications for this visit.    Allergies:   Ciprofloxacin and Sulfa antibiotics    Social History:  The patient  reports that she has never smoked. She has never used smokeless  tobacco. She reports that she does not drink alcohol or use illicit drugs.   Family History:  The patient's family history includes Lung cancer in her father; Stroke in her mother.    ROS:  Please see the history of present illness.   Otherwise, review of systems are positive for decreased appetite, progressive weight loss, and exertional fatigue..   All other systems are reviewed and negative.    PHYSICAL EXAM: VS:  BP 130/60 mmHg  Pulse 68  Ht 5\' 4"  (1.626 m)  Wt 62.052 kg (136 lb 12.8 oz)  BMI 23.47 kg/m2  SpO2 92% , BMI Body mass index is 23.47 kg/(m^2). GEN: Well nourished, well developed, in no acute distress HEENT: normal Neck: no JVD, carotid bruits, or masses Cardiac: RRR; the patient does have a 3/6 crescendo decrescendo systolic murmur of aortic stenosis. No rubs, or gallops,no edema  Respiratory:  clear to auscultation bilaterally, normal work of breathing GI: soft, nontender, nondistended, + BS MS: no deformity or atrophy Skin: warm and dry, no rash Neuro:  Strength and sensation are intact Psych: euthymic mood, full affect   EKG:  EKG is not ordered today.    Recent Labs: No results found for requested labs within last 365 days.    Lipid Panel No results  found for: CHOL, TRIG, HDL, CHOLHDL, VLDL, LDLCALC, LDLDIRECT    Wt Readings from Last 3 Encounters:  12/31/14 62.052 kg (136 lb 12.8 oz)  06/27/14 65.318 kg (144 lb)  12/12/13 68.947 kg (152 lb)      Other studies Reviewed: Additional studies/ records that were reviewed today include: No data to review other than prior echo done in February 2015. At that time aortic stenosis was moderate.. Review of the above records demonstrates:    ASSESSMENT AND PLAN:  1. Aortic valve disorder, moderately severe when last evaluated 2015 Calcific aortic stenosis. No clinically obvious change in status.  2. Essential hypertension Controlled  3. Chronic kidney disease, stage IV (severe) Followed by  nephrology  4. Weight loss 16 pounds over the past year with unknown cause. I encouraged that they mention this to the primary care physician. Patient is overall decreasing in vigor and fibrin see. With reference to aortic valve disease, she may not be a candidate for either percutaneous or invasive treatment if the valve have become severely obstructed.    Current medicines are reviewed at length with the patient today.  The patient does not have concerns regarding medicines.  The following changes have been made:  no change  Labs/ tests ordered today include:   Orders Placed This Encounter  Procedures  . Echocardiogram     Disposition:   FU with HS in 6 months  Signed, Lesleigh Noe, MD  12/31/2014 5:47 PM    [facility] [address] [place], [place], [place]  [address] Phone: [phone]; Fax: [phone]

## 2014-12-31 ENCOUNTER — Encounter: Payer: Self-pay | Admitting: Interventional Cardiology

## 2014-12-31 ENCOUNTER — Ambulatory Visit (INDEPENDENT_AMBULATORY_CARE_PROVIDER_SITE_OTHER): Payer: Medicare Other | Admitting: Interventional Cardiology

## 2014-12-31 VITALS — BP 130/60 | HR 68 | Ht 64.0 in | Wt 136.8 lb

## 2014-12-31 DIAGNOSIS — R634 Abnormal weight loss: Secondary | ICD-10-CM | POA: Insufficient documentation

## 2014-12-31 DIAGNOSIS — I10 Essential (primary) hypertension: Secondary | ICD-10-CM

## 2014-12-31 DIAGNOSIS — N184 Chronic kidney disease, stage 4 (severe): Secondary | ICD-10-CM | POA: Diagnosis not present

## 2014-12-31 DIAGNOSIS — I359 Nonrheumatic aortic valve disorder, unspecified: Secondary | ICD-10-CM

## 2014-12-31 NOTE — Patient Instructions (Signed)
Medication Instructions:  Your physician recommends that you continue on your current medications as directed. Please refer to the Current Medication list given to you today.   Labwork: None   Testing/Procedures: Your physician has requested that you have an echocardiogram. Echocardiography is a painless test that uses sound waves to create images of your heart. It provides your doctor with information about the size and shape of your heart and how well your heart's chambers and valves are working. This procedure takes approximately one hour. There are no restrictions for this procedure.   Follow-Up: Your physician wants you to follow-up in: 6 months with Dr.Smith You will receive a reminder letter in the mail two months in advance. If you don't receive a letter, please call our office to schedule the follow-up appointment.   Any Other Special Instructions Will Be Listed Below (If Applicable).

## 2015-01-09 ENCOUNTER — Other Ambulatory Visit (HOSPITAL_COMMUNITY): Payer: Medicare Other

## 2015-01-09 ENCOUNTER — Other Ambulatory Visit: Payer: Self-pay

## 2015-01-09 ENCOUNTER — Ambulatory Visit (HOSPITAL_COMMUNITY): Payer: Medicare Other | Attending: Cardiovascular Disease

## 2015-01-09 DIAGNOSIS — I358 Other nonrheumatic aortic valve disorders: Secondary | ICD-10-CM | POA: Diagnosis present

## 2015-01-09 DIAGNOSIS — N189 Chronic kidney disease, unspecified: Secondary | ICD-10-CM | POA: Diagnosis not present

## 2015-01-09 DIAGNOSIS — I129 Hypertensive chronic kidney disease with stage 1 through stage 4 chronic kidney disease, or unspecified chronic kidney disease: Secondary | ICD-10-CM | POA: Insufficient documentation

## 2015-01-09 DIAGNOSIS — I359 Nonrheumatic aortic valve disorder, unspecified: Secondary | ICD-10-CM

## 2015-01-09 DIAGNOSIS — I340 Nonrheumatic mitral (valve) insufficiency: Secondary | ICD-10-CM | POA: Insufficient documentation

## 2015-01-09 DIAGNOSIS — I352 Nonrheumatic aortic (valve) stenosis with insufficiency: Secondary | ICD-10-CM | POA: Insufficient documentation

## 2015-01-14 ENCOUNTER — Telehealth: Payer: Self-pay

## 2015-01-14 NOTE — Telephone Encounter (Signed)
-----   Message from Lyn Records, MD sent at 01/10/2015  1:08 PM EDT ----- Echo is stable compared with 2015 study. AS is moderate to severe and with no specific progression of complaints, we should continue conservative management.

## 2015-01-14 NOTE — Telephone Encounter (Signed)
Called to give pt echo results. Unable to lmom pt phone rings out

## 2015-01-16 ENCOUNTER — Telehealth: Payer: Self-pay

## 2015-01-16 NOTE — Telephone Encounter (Signed)
2nd attempt to reach pt. Unable to lmom pt phone rings out.

## 2015-01-16 NOTE — Telephone Encounter (Signed)
-----   Message from Henry W Smith, MD sent at 01/10/2015  1:08 PM EDT ----- Echo is stable compared with 2015 study. AS is moderate to severe and with no specific progression of complaints, we should continue conservative management. 

## 2015-06-12 ENCOUNTER — Encounter (HOSPITAL_COMMUNITY): Payer: Self-pay

## 2015-06-12 ENCOUNTER — Emergency Department (HOSPITAL_COMMUNITY)
Admission: EM | Admit: 2015-06-12 | Discharge: 2015-06-12 | Disposition: A | Discharge status: Home / Self Care | Payer: Medicare Other | Attending: Emergency Medicine | Admitting: Emergency Medicine

## 2015-06-12 DIAGNOSIS — Z8709 Personal history of other diseases of the respiratory system: Secondary | ICD-10-CM | POA: Insufficient documentation

## 2015-06-12 DIAGNOSIS — Z7982 Long term (current) use of aspirin: Secondary | ICD-10-CM | POA: Diagnosis not present

## 2015-06-12 DIAGNOSIS — I10 Essential (primary) hypertension: Secondary | ICD-10-CM | POA: Insufficient documentation

## 2015-06-12 DIAGNOSIS — R197 Diarrhea, unspecified: Secondary | ICD-10-CM | POA: Diagnosis not present

## 2015-06-12 DIAGNOSIS — Z79899 Other long term (current) drug therapy: Secondary | ICD-10-CM | POA: Insufficient documentation

## 2015-06-12 DIAGNOSIS — I509 Heart failure, unspecified: Secondary | ICD-10-CM | POA: Diagnosis not present

## 2015-06-12 DIAGNOSIS — G8929 Other chronic pain: Secondary | ICD-10-CM | POA: Insufficient documentation

## 2015-06-12 DIAGNOSIS — E039 Hypothyroidism, unspecified: Secondary | ICD-10-CM | POA: Diagnosis not present

## 2015-06-12 DIAGNOSIS — R110 Nausea: Secondary | ICD-10-CM | POA: Diagnosis not present

## 2015-06-12 DIAGNOSIS — F329 Major depressive disorder, single episode, unspecified: Secondary | ICD-10-CM | POA: Diagnosis not present

## 2015-06-12 DIAGNOSIS — Z8742 Personal history of other diseases of the female genital tract: Secondary | ICD-10-CM | POA: Insufficient documentation

## 2015-06-12 HISTORY — DX: Disorder of kidney and ureter, unspecified: N28.9

## 2015-06-12 LAB — COMPREHENSIVE METABOLIC PANEL
ALBUMIN: 3.7 g/dL (ref 3.5–5.0)
ALK PHOS: 101 U/L (ref 38–126)
ALT: 19 U/L (ref 14–54)
AST: 26 U/L (ref 15–41)
Anion gap: 10 (ref 5–15)
BUN: 22 mg/dL — AB (ref 6–20)
CALCIUM: 9.9 mg/dL (ref 8.9–10.3)
CHLORIDE: 105 mmol/L (ref 101–111)
CO2: 25 mmol/L (ref 22–32)
Creatinine, Ser: 1.91 mg/dL — ABNORMAL HIGH (ref 0.44–1.00)
GFR calc non Af Amer: 23 mL/min — ABNORMAL LOW (ref >60)
GFR, EST AFRICAN AMERICAN: 26 mL/min — AB (ref >60)
GLUCOSE: 113 mg/dL — AB (ref 65–99)
Potassium: 3.8 mmol/L (ref 3.5–5.1)
SODIUM: 140 mmol/L (ref 135–145)
Total Bilirubin: 0.6 mg/dL (ref 0.3–1.2)
Total Protein: 6.5 g/dL (ref 6.5–8.1)

## 2015-06-12 LAB — C DIFFICILE QUICK SCREEN W PCR REFLEX
C DIFFICILE (CDIFF) INTERP: NEGATIVE
C Diff antigen: NEGATIVE
C Diff toxin: NEGATIVE

## 2015-06-12 LAB — CBC
HCT: 38.4 % (ref 36.0–46.0)
Hemoglobin: 12.6 g/dL (ref 12.0–15.0)
MCH: 29.3 pg (ref 26.0–34.0)
MCHC: 32.8 g/dL (ref 30.0–36.0)
MCV: 89.3 fL (ref 78.0–100.0)
Platelets: 289 10*3/uL (ref 150–400)
RBC: 4.3 MIL/uL (ref 3.87–5.11)
RDW: 13.5 % (ref 11.5–15.5)
WBC: 9.1 10*3/uL (ref 4.0–10.5)

## 2015-06-12 LAB — GASTROINTESTINAL PANEL BY PCR, STOOL (REPLACES STOOL CULTURE)
ADENOVIRUS F40/41: NOT DETECTED
ASTROVIRUS: NOT DETECTED
CYCLOSPORA CAYETANENSIS: NOT DETECTED
Campylobacter species: NOT DETECTED
Cryptosporidium: NOT DETECTED
E. coli O157: NOT DETECTED
ENTEROAGGREGATIVE E COLI (EAEC): NOT DETECTED
ENTEROPATHOGENIC E COLI (EPEC): NOT DETECTED
ENTEROTOXIGENIC E COLI (ETEC): NOT DETECTED
Entamoeba histolytica: NOT DETECTED
GIARDIA LAMBLIA: NOT DETECTED
Norovirus GI/GII: NOT DETECTED
Plesimonas shigelloides: NOT DETECTED
ROTAVIRUS A: NOT DETECTED
SHIGA LIKE TOXIN PRODUCING E COLI (STEC): NOT DETECTED
Salmonella species: NOT DETECTED
Sapovirus (I, II, IV, and V): NOT DETECTED
Shigella/Enteroinvasive E coli (EIEC): NOT DETECTED
VIBRIO CHOLERAE: NOT DETECTED
VIBRIO SPECIES: NOT DETECTED
Yersinia enterocolitica: NOT DETECTED

## 2015-06-12 LAB — LIPASE, BLOOD: LIPASE: 115 U/L — AB (ref 11–51)

## 2015-06-12 MED ORDER — SODIUM CHLORIDE 0.9 % IV BOLUS (SEPSIS)
500.0000 mL | Freq: Once | INTRAVENOUS | Status: AC
  Administered 2015-06-12: 500 mL via INTRAVENOUS

## 2015-06-12 NOTE — Discharge Instructions (Signed)

## 2015-06-12 NOTE — ED Notes (Signed)
Pt reports diarrhea and nausea that has been present x1 week.  Pt reports she was seen by her PMD who believes it is pancreas related.  Pt reports multiple episodes of diarrhea a day and a weight loss of 8lbs in one week.  No vomiting

## 2015-06-12 NOTE — ED Provider Notes (Signed)
CSN: [id number]     Arrival date & time 06/12/15  0755 History   First MD Initiated Contact with Patient 06/12/15 0801     Chief Complaint  Patient presents with  . Diarrhea     (Consider location/radiation/quality/duration/timing/severity/associated sxs/prior Treatment) HPI   43 y f w pmh chf, htn, hypothyroid, renal disorder who has been having one week of diarrhea which has been non-bloody associated with mild nausea, no vomiting.  No fever/chills/chest pain or other sx.  She has been seen by her pcp who started cipro/flagyl and she continues to take flagyl w/o improvement.  Past Medical History  Diagnosis Date  . CHF (congestive heart failure) (HCC)   . Hypertension   . Depression   . Hypothyroidism   . Chronic back pain   . Headache(784.0)   . Bronchitis   . Heart valve disease   . Renal disorder    Past Surgical History  Procedure Laterality Date  . Tonsillectomy    . Abdominal hysterectomy    . Hernia repair     Family History  Problem Relation Age of Onset  . Stroke Mother   . Lung cancer Father    Social History  Substance Use Topics  . Smoking status: Never Smoker   . Smokeless tobacco: Never Used  . Alcohol Use: No   OB History    No data available     Review of Systems  Constitutional: Negative for fever and chills.  HENT: Negative for nosebleeds.   Eyes: Negative for visual disturbance.  Respiratory: Negative for cough and shortness of breath.   Cardiovascular: Negative for chest pain.  Gastrointestinal: Positive for diarrhea. Negative for nausea, vomiting, abdominal pain and constipation.  Genitourinary: Negative for dysuria.  Skin: Negative for rash.  Neurological: Negative for weakness.  All other systems reviewed and are negative.     Allergies  Ciprofloxacin and Sulfa antibiotics  Home Medications   Prior to Admission medications   Medication Sig Start Date End Date Taking? Authorizing Provider  aspirin 81 MG chewable tablet Chew  81 mg by mouth daily.   Yes Historical Provider, MD  FLUoxetine (PROZAC) 40 MG capsule Take 40 mg by mouth daily.   Yes Historical Provider, MD  furosemide (LASIX) 40 MG tablet Take 1 tablet (40 mg total) by mouth daily. 06/08/11  Yes Elease Etienne, MD  levothyroxine (SYNTHROID, LEVOTHROID) 25 MCG tablet Take 25 mcg by mouth daily. 02/27/13  Yes Historical Provider, MD  losartan (COZAAR) 25 MG tablet Take 25 mg by mouth daily.   Yes Historical Provider, MD  metroNIDAZOLE (FLAGYL) 500 MG tablet Take 500 mg by mouth every 8 (eight) hours. Started on 06-04-15 for 10 days   Yes Historical Provider, MD  Multiple Vitamins-Minerals (PRESERVISION AREDS 2 PO) Take 1 capsule by mouth 2 (two) times daily.   Yes Historical Provider, MD  temazepam (RESTORIL) 15 MG capsule Take 15 mg by mouth at bedtime.    Yes Historical Provider, MD  morphine (MSIR) 15 MG tablet Take 15 mg by mouth 3 (three) times daily with meals.     Historical Provider, MD   BP 138/57 mmHg  Pulse 83  Temp(Src) 98.2 F (36.8 C) (Oral)  Resp 24  Ht  (1.575 m)  Wt 57.153 kg  BMI 23.04 kg/m2  SpO2 95% Physical Exam  Constitutional: She is oriented to person, place, and time. No distress.  HENT:  Head: Normocephalic and atraumatic.  Eyes: EOM are normal. Pupils are equal, round, and  reactive to light.  Neck: Normal range of motion. Neck supple.  Cardiovascular: Normal rate and intact distal pulses.   Pulmonary/Chest: No respiratory distress.  Abdominal: Soft. She exhibits no distension. There is no tenderness. There is no rebound and no guarding.  Musculoskeletal: Normal range of motion.  Neurological: She is alert and oriented to person, place, and time.  Skin: No rash noted. She is not diaphoretic.  Psychiatric: She has a normal mood and affect.    ED Course  Procedures (including critical care time) Labs Review Labs Reviewed  LIPASE, BLOOD - Abnormal; Notable for the following:    Lipase 115 (*)    All other  components within normal limits  COMPREHENSIVE METABOLIC PANEL - Abnormal; Notable for the following:    Glucose, Bld 113 (*)    BUN 22 (*)    Creatinine, Ser 1.91 (*)    GFR calc non Af Amer 23 (*)    GFR calc Af Amer 26 (*)    All other components within normal limits  GASTROINTESTINAL PANEL BY PCR, STOOL (REPLACES STOOL CULTURE)  C DIFFICILE QUICK SCREEN W PCR REFLEX  CBC    Imaging Review No results found. I have personally reviewed and evaluated these images and lab results as part of my medical decision-making.   EKG Interpretation None      MDM   Final diagnoses:  Diarrhea, unspecified type    76 y f w pmh chf, htn, hypothyroid, renal disorder who has been having one week of diarrhea which has been non-bloody associated with mild nausea, no vomiting.  No abdominal pain.  Seen by primary and she had outpatient labs earlier this week which showed an elevated lipase around 150.  Patient has continued to have some loose stools and nausea but no vomiting.  Will repeat cbc/cmp/lipase.   Cbc/cmp unremarkable.  Lipase mildly elevated but improved since the other day.  Pt able to tolerate PO.  Have sent stool studies which were neg.  Will d/c with pcp f/u  I have discussed the results, Dx and Tx plan with the pt. They expressed understanding and agree with the plan and were told to return to ED with any worsening of condition or concern.    Disposition: Discharge  Condition: Good  Discharge Medication List as of 06/12/2015 10:22 AM      Follow Up: Catha Gosselin, MD [address] [place] [address] [phone]      Pt seen in conjunction with Dr. Benjie Karvonen, MD 06/12/15 1621  Marily Memos, MD 06/13/15 [phone]

## 2015-06-24 ENCOUNTER — Emergency Department (HOSPITAL_COMMUNITY): Payer: Medicare Other

## 2015-06-24 ENCOUNTER — Observation Stay (HOSPITAL_COMMUNITY)
Admission: EM | Admit: 2015-06-24 | Discharge: 2015-06-26 | Disposition: A | Discharge status: Skilled Nursing Facility | Payer: Medicare Other | Attending: Internal Medicine | Admitting: Internal Medicine

## 2015-06-24 ENCOUNTER — Encounter (HOSPITAL_COMMUNITY): Payer: Self-pay | Admitting: Family Medicine

## 2015-06-24 DIAGNOSIS — M549 Dorsalgia, unspecified: Secondary | ICD-10-CM | POA: Diagnosis not present

## 2015-06-24 DIAGNOSIS — Z7982 Long term (current) use of aspirin: Secondary | ICD-10-CM | POA: Insufficient documentation

## 2015-06-24 DIAGNOSIS — F329 Major depressive disorder, single episode, unspecified: Secondary | ICD-10-CM | POA: Diagnosis not present

## 2015-06-24 DIAGNOSIS — I5032 Chronic diastolic (congestive) heart failure: Secondary | ICD-10-CM | POA: Insufficient documentation

## 2015-06-24 DIAGNOSIS — I10 Essential (primary) hypertension: Secondary | ICD-10-CM | POA: Diagnosis not present

## 2015-06-24 DIAGNOSIS — E039 Hypothyroidism, unspecified: Secondary | ICD-10-CM | POA: Insufficient documentation

## 2015-06-24 DIAGNOSIS — W010XXA Fall on same level from slipping, tripping and stumbling without subsequent striking against object, initial encounter: Secondary | ICD-10-CM | POA: Insufficient documentation

## 2015-06-24 DIAGNOSIS — S329XXA Fracture of unspecified parts of lumbosacral spine and pelvis, initial encounter for closed fracture: Secondary | ICD-10-CM

## 2015-06-24 DIAGNOSIS — S32591A Other specified fracture of right pubis, initial encounter for closed fracture: Secondary | ICD-10-CM | POA: Insufficient documentation

## 2015-06-24 DIAGNOSIS — Z66 Do not resuscitate: Secondary | ICD-10-CM | POA: Diagnosis not present

## 2015-06-24 DIAGNOSIS — Z882 Allergy status to sulfonamides status: Secondary | ICD-10-CM | POA: Diagnosis not present

## 2015-06-24 DIAGNOSIS — Y92019 Unspecified place in single-family (private) house as the place of occurrence of the external cause: Secondary | ICD-10-CM | POA: Insufficient documentation

## 2015-06-24 DIAGNOSIS — M542 Cervicalgia: Secondary | ICD-10-CM | POA: Insufficient documentation

## 2015-06-24 DIAGNOSIS — E860 Dehydration: Secondary | ICD-10-CM | POA: Insufficient documentation

## 2015-06-24 DIAGNOSIS — I130 Hypertensive heart and chronic kidney disease with heart failure and stage 1 through stage 4 chronic kidney disease, or unspecified chronic kidney disease: Secondary | ICD-10-CM | POA: Insufficient documentation

## 2015-06-24 DIAGNOSIS — I359 Nonrheumatic aortic valve disorder, unspecified: Secondary | ICD-10-CM | POA: Diagnosis not present

## 2015-06-24 DIAGNOSIS — N179 Acute kidney failure, unspecified: Secondary | ICD-10-CM | POA: Diagnosis not present

## 2015-06-24 DIAGNOSIS — Y9389 Activity, other specified: Secondary | ICD-10-CM | POA: Diagnosis not present

## 2015-06-24 DIAGNOSIS — S32511A Fracture of superior rim of right pubis, initial encounter for closed fracture: Principal | ICD-10-CM | POA: Insufficient documentation

## 2015-06-24 DIAGNOSIS — G8929 Other chronic pain: Secondary | ICD-10-CM | POA: Insufficient documentation

## 2015-06-24 DIAGNOSIS — Y998 Other external cause status: Secondary | ICD-10-CM | POA: Diagnosis not present

## 2015-06-24 DIAGNOSIS — S32810A Multiple fractures of pelvis with stable disruption of pelvic ring, initial encounter for closed fracture: Secondary | ICD-10-CM | POA: Diagnosis present

## 2015-06-24 DIAGNOSIS — S32599A Other specified fracture of unspecified pubis, initial encounter for closed fracture: Secondary | ICD-10-CM | POA: Diagnosis present

## 2015-06-24 DIAGNOSIS — W19XXXA Unspecified fall, initial encounter: Secondary | ICD-10-CM | POA: Diagnosis present

## 2015-06-24 DIAGNOSIS — N184 Chronic kidney disease, stage 4 (severe): Secondary | ICD-10-CM | POA: Diagnosis present

## 2015-06-24 DIAGNOSIS — Z881 Allergy status to other antibiotic agents status: Secondary | ICD-10-CM | POA: Diagnosis not present

## 2015-06-24 LAB — COMPREHENSIVE METABOLIC PANEL
ALBUMIN: 3.2 g/dL — AB (ref 3.5–5.0)
ALK PHOS: 76 U/L (ref 38–126)
ALT: 20 U/L (ref 14–54)
AST: 30 U/L (ref 15–41)
Anion gap: 12 (ref 5–15)
BILIRUBIN TOTAL: 0.8 mg/dL (ref 0.3–1.2)
BUN: 70 mg/dL — ABNORMAL HIGH (ref 6–20)
CALCIUM: 9.6 mg/dL (ref 8.9–10.3)
CO2: 26 mmol/L (ref 22–32)
CREATININE: 2.37 mg/dL — AB (ref 0.44–1.00)
Chloride: 99 mmol/L — ABNORMAL LOW (ref 101–111)
GFR calc non Af Amer: 18 mL/min — ABNORMAL LOW (ref >60)
GFR, EST AFRICAN AMERICAN: 20 mL/min — AB (ref >60)
GLUCOSE: 174 mg/dL — AB (ref 65–99)
Potassium: 4.6 mmol/L (ref 3.5–5.1)
SODIUM: 137 mmol/L (ref 135–145)
TOTAL PROTEIN: 6.1 g/dL — AB (ref 6.5–8.1)

## 2015-06-24 LAB — CBC WITH DIFFERENTIAL/PLATELET
BASOS ABS: 0 10*3/uL (ref 0.0–0.1)
BASOS PCT: 0 %
EOS PCT: 0 %
Eosinophils Absolute: 0 10*3/uL (ref 0.0–0.7)
HCT: 32 % — ABNORMAL LOW (ref 36.0–46.0)
Hemoglobin: 10.2 g/dL — ABNORMAL LOW (ref 12.0–15.0)
LYMPHS PCT: 5 %
Lymphs Abs: 0.6 10*3/uL — ABNORMAL LOW (ref 0.7–4.0)
MCH: 28.9 pg (ref 26.0–34.0)
MCHC: 31.9 g/dL (ref 30.0–36.0)
MCV: 90.7 fL (ref 78.0–100.0)
MONO ABS: 0.6 10*3/uL (ref 0.1–1.0)
Monocytes Relative: 6 %
Neutro Abs: 9.6 10*3/uL — ABNORMAL HIGH (ref 1.7–7.7)
Neutrophils Relative %: 89 %
Platelets: 193 10*3/uL (ref 150–400)
RBC: 3.53 MIL/uL — AB (ref 3.87–5.11)
RDW: 13.5 % (ref 11.5–15.5)
WBC: 10.8 10*3/uL — AB (ref 4.0–10.5)

## 2015-06-24 LAB — TYPE AND SCREEN
ABO/RH(D): O NEG
ANTIBODY SCREEN: NEGATIVE

## 2015-06-24 LAB — PROTIME-INR
INR: 1.26 (ref 0.00–1.49)
Prothrombin Time: 15.9 seconds — ABNORMAL HIGH (ref 11.6–15.2)

## 2015-06-24 LAB — APTT: aPTT: 31 seconds (ref 24–37)

## 2015-06-24 LAB — ABO/RH: ABO/RH(D): O NEG

## 2015-06-24 MED ORDER — LEVOTHYROXINE SODIUM 25 MCG PO TABS
25.0000 ug | ORAL_TABLET | Freq: Every day | ORAL | Status: DC
  Administered 2015-06-25 – 2015-06-26 (×2): 25 ug via ORAL
  Filled 2015-06-24 (×2): qty 1

## 2015-06-24 MED ORDER — OXYCODONE-ACETAMINOPHEN 5-325 MG PO TABS
1.0000 | ORAL_TABLET | Freq: Four times a day (QID) | ORAL | Status: DC | PRN
  Administered 2015-06-24 – 2015-06-26 (×2): 1 via ORAL
  Filled 2015-06-24 (×2): qty 1

## 2015-06-24 MED ORDER — CALCITRIOL 0.25 MCG PO CAPS
0.2500 ug | ORAL_CAPSULE | ORAL | Status: DC
  Administered 2015-06-24 – 2015-06-26 (×2): 0.25 ug via ORAL
  Filled 2015-06-24 (×2): qty 1

## 2015-06-24 MED ORDER — ASPIRIN 81 MG PO CHEW
81.0000 mg | CHEWABLE_TABLET | Freq: Every day | ORAL | Status: DC
  Administered 2015-06-24 – 2015-06-26 (×3): 81 mg via ORAL
  Filled 2015-06-24 (×3): qty 1

## 2015-06-24 MED ORDER — FENTANYL CITRATE (PF) 100 MCG/2ML IJ SOLN: 12.5000 ug | INTRAMUSCULAR | Status: DC | PRN

## 2015-06-24 MED ORDER — ACETAMINOPHEN 325 MG PO TABS: 650.0000 mg | ORAL_TABLET | Freq: Four times a day (QID) | ORAL | Status: DC | PRN

## 2015-06-24 MED ORDER — TEMAZEPAM 15 MG PO CAPS
15.0000 mg | ORAL_CAPSULE | Freq: Every day | ORAL | Status: DC
  Administered 2015-06-24 – 2015-06-25 (×2): 15 mg via ORAL
  Filled 2015-06-24 (×2): qty 1

## 2015-06-24 MED ORDER — ACETAMINOPHEN 650 MG RE SUPP: 650.0000 mg | Freq: Four times a day (QID) | RECTAL | Status: DC | PRN

## 2015-06-24 MED ORDER — PROSIGHT PO TABS
1.0000 | ORAL_TABLET | Freq: Every day | ORAL | Status: DC
  Administered 2015-06-25 – 2015-06-26 (×2): 1 via ORAL
  Filled 2015-06-24 (×2): qty 1

## 2015-06-24 MED ORDER — ONDANSETRON HCL 4 MG/2ML IJ SOLN
4.0000 mg | Freq: Three times a day (TID) | INTRAMUSCULAR | Status: AC | PRN
Start: 2015-06-24 — End: 2015-06-25

## 2015-06-24 MED ORDER — SODIUM CHLORIDE 0.9 % IV SOLN
INTRAVENOUS | Status: DC
  Administered 2015-06-24: 50 mL/h via INTRAVENOUS

## 2015-06-24 MED ORDER — PRESERVISION AREDS 2 PO CAPS: 1.0000 | ORAL_CAPSULE | Freq: Two times a day (BID) | ORAL | Status: DC

## 2015-06-24 MED ORDER — SODIUM CHLORIDE 0.9% FLUSH
3.0000 mL | Freq: Two times a day (BID) | INTRAVENOUS | Status: DC
  Administered 2015-06-24 – 2015-06-25 (×3): 3 mL via INTRAVENOUS

## 2015-06-24 NOTE — ED Notes (Addendum)
Pt here for right hip pain after fall. sts she has fell 3 times the past week. sts vision is off. sts everything is foggy. sts recently started taking her morphine again. sts abd issues and has been on abx. Pupils pin point. Pt A&Ox3.

## 2015-06-24 NOTE — H&P (Addendum)
[facility] History and Physical  JANAISA BIRKLAND ZOX:[id number] DOB: 1927/05/23 DOA: 06/24/2015  Referring physician: ED physician PCP: Mickie Hillier, MD  Specialists:   Chief Complaint: right hip pain after fall  HPI: Julie Mclean is a 82 y.o. female with PMH of hypertension, hypothyroidism, depression, diastolic congestive heart failure, chronic kidney disease-stage IV, bronchitis, poor hearing, aortic valve disorder, pancreatitis, diverticulitis, who presents with right hip pain after fall.  Per pt's daughter, patient had fall 3 times in the past week. She had fall again today, causing injury to her right hip. She has moderate pain over right hip. She does not have tingling sensations in her legs. Patient denies unilateral weakness, numbness or tenderness in her extremities. She denies head or neck injury. No LOC. She has chronic neck pain and back pain which have not changed. She was intermittently given prescription for morphine for chronic back pain and neck pain. She states that because of severe pain, she recently started taking her morphine again. Daughter reports that she was treated with Flagyl for 10 days for "possible inflammation of pancreas" (her lipase was 115 on 06/12/15). Because of side effects of nausea and diarrhea, patient stopped taking Flagyl. Her nausea and diarrhea have completely resolved. Currently patient does not have abdominal pain, diarrhea, nausea or vomiting. Patient denies chest pain, shortness breath, cough, symptoms of UTI.  In ED, patient was found to have WBC 10.8, temperature normal, no tachycardia, worsening renal function. X-ray of right hip/pelvis showed comminuted fractures of the right superior and inferior pubic rami. Patient admitted to inpatient for further eval and treatment. Orthopedic surgeon will be consulted by EDP.  EKG: Not done in ED, will get one.   Where does patient live?   At home   Can patient participate in ADLs?  None  Review of  Systems:   General: no fevers, chills, no changes in body weight, has poor appetite, has fatigue HEENT: no blurry vision, hearing changes or sore throat Pulm: no dyspnea, coughing, wheezing CV: no chest pain, palpitations Abd: no nausea, vomiting, abdominal pain, diarrhea, constipation GU: no dysuria, burning on urination, increased urinary frequency, hematuria  Ext: no leg edema Neuro: no unilateral weakness, numbness, or tingling, no vision change or hearing loss Skin: no rash MSK: has right hip pain Heme: No easy bruising.  Travel history: No recent long distant travel.  Allergy:  Allergies  Allergen Reactions  . Ciprofloxacin Rash  . Sulfa Antibiotics Rash    Past Medical History  Diagnosis Date  . CHF (congestive heart failure) (HCC)   . Hypertension   . Depression   . Hypothyroidism   . Chronic back pain   . Headache(784.0)   . Bronchitis   . Heart valve disease   . Renal disorder     Past Surgical History  Procedure Laterality Date  . Tonsillectomy    . Abdominal hysterectomy    . Hernia repair      Social History:  reports that she has never smoked. She has never used smokeless tobacco. She reports that she does not drink alcohol or use illicit drugs.  Family History:  Family History  Problem Relation Age of Onset  . Stroke Mother   . Lung cancer Father      Prior to Admission medications   Medication Sig Start Date End Date Taking? Authorizing Provider  aspirin 81 MG chewable tablet Chew 81 mg by mouth daily.    Historical Provider, MD  FLUoxetine (PROZAC) 40 MG capsule Take  40 mg by mouth daily.    Historical Provider, MD  furosemide (LASIX) 40 MG tablet Take 1 tablet (40 mg total) by mouth daily. 06/08/11   Elease Etienne, MD  levothyroxine (SYNTHROID, LEVOTHROID) 25 MCG tablet Take 25 mcg by mouth daily. 02/27/13   Historical Provider, MD  losartan (COZAAR) 25 MG tablet Take 25 mg by mouth daily.    Historical Provider, MD  metroNIDAZOLE (FLAGYL)  500 MG tablet Take 500 mg by mouth every 8 (eight) hours. Started on 06-04-15 for 10 days    Historical Provider, MD  morphine (MSIR) 15 MG tablet Take 15 mg by mouth 3 (three) times daily with meals.     Historical Provider, MD  Multiple Vitamins-Minerals (PRESERVISION AREDS 2 PO) Take 1 capsule by mouth 2 (two) times daily.    Historical Provider, MD  temazepam (RESTORIL) 15 MG capsule Take 15 mg by mouth at bedtime.     Historical Provider, MD    Physical Exam: Filed Vitals:   06/24/15 1945 06/24/15 2000 06/24/15 2015 06/24/15 2126  BP: 111/42 114/48 103/43 112/54  Pulse: 86 87 85 87  Temp:    98.4 F (36.9 C)  TempSrc:    Oral  Resp: SpO2: 99% 99% 98% 97%   General: Not in acute distress HEENT:       Eyes: PERRL, EOMI, no scleral icterus.       ENT: No discharge from the ears and nose, no pharynx injection, no tonsillar enlargement.        Neck: No JVD, no bruit, no mass felt. Heme: No neck lymph node enlargement. Cardiac: S1/S2, RRR, No murmurs, No gallops or rubs. Pulm: No rales, wheezing, rhonchi or rubs. Abd: Soft, nondistended, nontender, no rebound pain, no organomegaly, BS present. Ext: No pitting leg edema bilaterally. 2+DP/PT pulse bilaterally. Musculoskeletal: has tenderness over right hip. Skin: No rashes.  Neuro: Alert, oriented place and person, but not to time, cranial nerves II-XII grossly intact, moves all extremities. Psych: Patient is not psychotic, no suicidal or hemocidal ideation.  Labs on Admission:  Basic Metabolic Panel:  Recent Labs Lab 06/24/15 1758  NA 137  K 4.6  CL 99*  CO2 26  GLUCOSE 174*  BUN 70*  CREATININE 2.37*  CALCIUM 9.6   Liver Function Tests:  Recent Labs Lab 06/24/15 1758  AST 30  ALT 20  ALKPHOS 76  BILITOT 0.8  PROT 6.1*  ALBUMIN 3.2*   No results for input(s): LIPASE, AMYLASE in the last 168 hours. No results for input(s): AMMONIA in the last 168 hours. CBC:  Recent Labs Lab 06/24/15 1758   WBC 10.8*  NEUTROABS 9.6*  HGB 10.2*  HCT 32.0*  MCV 90.7  PLT 193   Cardiac Enzymes: No results for input(s): CKTOTAL, CKMB, CKMBINDEX, TROPONINI in the last 168 hours.  BNP (last 3 results) No results for input(s): BNP in the last 8760 hours.  ProBNP (last 3 results) No results for input(s): PROBNP in the last 8760 hours.  CBG: No results for input(s): GLUCAP in the last 168 hours.  Radiological Exams on Admission: Dg Hip Unilat  With Pelvis 2-3 Views Right  06/24/2015  CLINICAL DATA:  Larey Seat at home today.  Hip pain. EXAM: DG HIP (WITH OR WITHOUT PELVIS) 2-3V RIGHT COMPARISON:  None. FINDINGS: There is no evidence of hip fracture or dislocation. There is a comminuted fracture of the right inferior and superior pubic rami. There are degenerative changes of bilateral SI joints.  IMPRESSION: Comminuted fractures of the right superior and inferior pubic rami. Electronically Signed   By: Elige Ko   On: 06/24/2015 18:51    Assessment/Plan Principal Problem:   Fracture of pubic ramus (HCC) Active Problems:   Essential hypertension   Aortic valve disorder   Acute renal failure superimposed on stage 4 chronic kidney disease (HCC)   Fall   Chronic diastolic congestive heart failure (HCC)  Fracture of pubic ramus (HCC): As evidenced by x-ray. Patient has moderate pain now. No neurovascular compromise. Orthopedic surgeon was consulted. - will admit to tele bed - Pain control: change home MSIR 15 mg tid to bid and prn and percocet - follow up ortho recs - NPO after MN - type and cross - INR/PTT  Leukocytosis: WBC 1.8. Likely due to stress-induced demargination. Patient does not have signs of infection. -Follow-up CBC  Fall: etiology is not clear. Differential diagnoses include orthostatic vital signs and side effects of morphine. Patient denies head or neck injury. I discussed with her daughter about doing CT head and neck. Her daughter refused and would like to hold off CT scan  now unless pt develops any focal neurologic signs. Currently patient does not have focal neurologic findings on physical examination. Patient mental status is at baseline per her daughter. -Observe closely -orthostatic vital signs -PTOT  HTN: blood pressure 102/45 -hold losartan due to worsening renal function -Hydralazine IV when necessary  Chronic diastolic congestive heart failure: 2-D echo 01/09/15 showed EF 55-60 percent with grade 1 diastolic dysfunction. Patient does not have leg edema. CHF is compensated on admission. -hold Lasix due to worsening renal function -Continue aspirin -Check BMP  AoCKD-IV: f/u by Dr. Stephens Shire. Baseline Cre is 2.37 and BUN 70 on admission. Likely due to prerenal secondary to dehydration and continuation of ARB and diruetics. - IVF: NS 50 cc/h - Check FeUrea - Follow up renal function by BMP - Hold Diuretics and losartan - check UA   DVT ppx: SCD  Code Status: DNR Family Communication:  Yes, patient's daughter at bed side Disposition Plan: Admit to inpatient   Date of Service 06/25/2015    Lorretta Harp [facility] Pager [phone]  If 7PM-7AM, please contact night-coverage [email or web address] Password [email or web address] 06/25/2015, 12:19 AM

## 2015-06-24 NOTE — ED Notes (Signed)
Report attempted. RN receiving patient is unavailable at this time.

## 2015-06-25 DIAGNOSIS — S32810A Multiple fractures of pelvis with stable disruption of pelvic ring, initial encounter for closed fracture: Secondary | ICD-10-CM | POA: Diagnosis present

## 2015-06-25 DIAGNOSIS — N184 Chronic kidney disease, stage 4 (severe): Secondary | ICD-10-CM

## 2015-06-25 DIAGNOSIS — I10 Essential (primary) hypertension: Secondary | ICD-10-CM

## 2015-06-25 DIAGNOSIS — I5032 Chronic diastolic (congestive) heart failure: Secondary | ICD-10-CM | POA: Diagnosis present

## 2015-06-25 DIAGNOSIS — S32501D Unspecified fracture of right pubis, subsequent encounter for fracture with routine healing: Secondary | ICD-10-CM

## 2015-06-25 DIAGNOSIS — N179 Acute kidney failure, unspecified: Secondary | ICD-10-CM

## 2015-06-25 LAB — BASIC METABOLIC PANEL
Anion gap: 9 (ref 5–15)
BUN: 70 mg/dL — AB (ref 6–20)
CHLORIDE: 100 mmol/L — AB (ref 101–111)
CO2: 30 mmol/L (ref 22–32)
CREATININE: 2.18 mg/dL — AB (ref 0.44–1.00)
Calcium: 9 mg/dL (ref 8.9–10.3)
GFR calc Af Amer: 23 mL/min — ABNORMAL LOW (ref >60)
GFR calc non Af Amer: 19 mL/min — ABNORMAL LOW (ref >60)
GLUCOSE: 109 mg/dL — AB (ref 65–99)
POTASSIUM: 4.4 mmol/L (ref 3.5–5.1)
SODIUM: 139 mmol/L (ref 135–145)

## 2015-06-25 LAB — CBC
HEMATOCRIT: 27.1 % — AB (ref 36.0–46.0)
Hemoglobin: 9 g/dL — ABNORMAL LOW (ref 12.0–15.0)
MCH: 30.1 pg (ref 26.0–34.0)
MCHC: 33.2 g/dL (ref 30.0–36.0)
MCV: 90.6 fL (ref 78.0–100.0)
PLATELETS: 138 10*3/uL — AB (ref 150–400)
RBC: 2.99 MIL/uL — ABNORMAL LOW (ref 3.87–5.11)
RDW: 13.3 % (ref 11.5–15.5)
WBC: 6.7 10*3/uL (ref 4.0–10.5)

## 2015-06-25 LAB — GLUCOSE, CAPILLARY
Glucose-Capillary: 92 mg/dL (ref 65–99)
Glucose-Capillary: 121 mg/dL — ABNORMAL HIGH (ref 65–99)

## 2015-06-25 LAB — BRAIN NATRIURETIC PEPTIDE: B Natriuretic Peptide: 247.2 pg/mL — ABNORMAL HIGH (ref 0.0–100.0)

## 2015-06-25 MED ORDER — MORPHINE SULFATE 15 MG PO TABS: 15.0000 mg | ORAL_TABLET | Freq: Three times a day (TID) | ORAL | Status: DC | PRN

## 2015-06-25 MED ORDER — MORPHINE SULFATE 15 MG PO TABS
15.0000 mg | ORAL_TABLET | Freq: Three times a day (TID) | ORAL | Status: DC
  Administered 2015-06-25 – 2015-06-26 (×4): 15 mg via ORAL
  Filled 2015-06-25 (×4): qty 1

## 2015-06-25 NOTE — Evaluation (Signed)
Physical Therapy Evaluation Patient Details Name: Julie Mclean MRN: [id number] DOB: [birth year] Today's Date: 06/25/2015   History of Present Illness  Pt admitted after fall with right pubic rami fx. PMHx: fall, CHF, depression, HTN, HOH  Clinical Impression  Pt pleasant but very HOH despite use of hearing aid. Pt with decreased strength, mobility, gait and transfers who will benefit from acute therapy to maximize mobility, function and gait to decrease burden of care. Pt and family educated for transfers, RW use and progression.     Follow Up Recommendations SNF;Supervision/Assistance - 24 hour    Equipment Recommendations  None recommended by PT    Recommendations for Other Services       Precautions / Restrictions Precautions Precautions: Fall Restrictions Weight Bearing Restrictions: Yes RLE Weight Bearing: Weight bearing as tolerated      Mobility  Bed Mobility Overal bed mobility: Needs Assistance Bed Mobility: Supine to Sit     Supine to sit: Min assist     General bed mobility comments: cues for sequence with assist to move RLE to pivot to EOB and assist to elevate trunk  Transfers Overall transfer level: Needs assistance   Transfers: Sit to/from Stand;Stand Pivot Transfers Sit to Stand: Mod assist Stand pivot transfers: Mod assist;+2 safety/equipment       General transfer comment: cues for hand placement, sequence and safety. Hand over hand cueing for positioning secondary to [facility]. Pt able to pivot bed to chair with RW but denied stepping further due to pain, sequential cues  Ambulation/Gait                Stairs            Wheelchair Mobility    Modified Rankin (Stroke Patients Only)       Balance                                             Pertinent Vitals/Pain Pain Assessment: 0-10 Pain Score: 5  Pain Location: right groin Pain Descriptors / Indicators: Aching;Throbbing Pain Intervention(s): Limited activity  within patient's tolerance;Monitored during session;Repositioned    Home Living Family/patient expects to be discharged to:: Private residence Living Arrangements: Spouse/significant other Available Help at Discharge: Family;Available PRN/intermittently Type of Home: Apartment Home Access: Level entry     Home Layout: One level Home Equipment: Walker - 2 wheels;Walker - 4 wheels;Cane - single point;Shower seat      Prior Function Level of Independence: Independent with assistive device(s)         Comments: pt was using rollator in home, living with spouse and performing ADLs     Hand Dominance        Extremity/Trunk Assessment   Upper Extremity Assessment: Generalized weakness           Lower Extremity Assessment: Generalized weakness      Cervical / Trunk Assessment: Kyphotic  Communication   Communication: HOH  Cognition Arousal/Alertness: Awake/alert Behavior During Therapy: WFL for tasks assessed/performed Overall Cognitive Status: Difficult to assess                      General Comments      Exercises        Assessment/Plan    PT Assessment Patient needs continued PT services  PT Diagnosis Difficulty walking;Acute pain;Generalized weakness   PT Problem List Decreased strength;Decreased range of motion;Decreased  activity tolerance;Decreased balance;Decreased mobility;Pain;Decreased knowledge of use of DME  PT Treatment Interventions Gait training;DME instruction;Functional mobility training;Therapeutic activities;Therapeutic exercise;Balance training;Patient/family education   PT Goals (Current goals can be found in the Care Plan section) Acute Rehab PT Goals Patient Stated Goal: be able to walk and return home PT Goal Formulation: With patient/family Time For Goal Achievement: 07/09/15 Potential to Achieve Goals: Fair    Frequency Min 3X/week   Barriers to discharge Decreased caregiver support      Co-evaluation                End of Session Equipment Utilized During Treatment: Gait belt Activity Tolerance: Patient tolerated treatment well Patient left: in chair;with call bell/phone within reach;with chair alarm set;with family/visitor present Nurse Communication: Mobility status;Weight bearing status;Precautions         Time: 4540-9811 PT Time Calculation (min) (ACUTE ONLY): 14 min   Charges:   PT Evaluation $PT Eval Moderate Complexity: 1 Procedure     PT G CodesDelorse Lek 06/25/2015, 10:07 AM Delaney Meigs, PT [phone]

## 2015-06-25 NOTE — Discharge Summary (Signed)
Discharge Summary  Julie Mclean:[id number] DOB: [birth year]  PCP: Mickie Hillier, MD  Admit date: 06/24/2015 Anticipated Discharge date: 06/26/2015  Time spent: 25 minutes  Recommendations for Outpatient Follow-up:  1. Medication clarification: Patient had recently restarted on morphine sulfate instant release 15 mg by mouth 3 times a day. In discussion with her daughter, there was concern that the medication was causing her to be somewhat drowsy and may have contributed to her fall. The symptom, patient was having active pain. We will do compromise and agree that she'll be discharged on morphine sulfate every 8 hours as needed rather than scheduled 2. Patient being discharged to skilled nursing facility   Discharge Diagnoses:  Active Hospital Problems   Diagnosis Date Noted  . Fracture of pubic ramus (HCC) 06/24/2015  . Chronic diastolic congestive heart failure (HCC) 06/25/2015  . Pelvic ring fracture (HCC) 06/25/2015  . Fall 06/24/2015  . Acute renal failure superimposed on stage 4 chronic kidney disease (HCC) 06/01/2011  . Essential hypertension 09/08/2007  . Aortic valve disorder 09/08/2007    Resolved Hospital Problems   Diagnosis Date Noted Date Resolved  No resolved problems to display.    Discharge Condition: Improved, being discharged to skilled nursing facility  Diet recommendation: Heart healthy   Filed Weights   06/25/15 1309  Weight: 73.029 kg (161 lb)    History of present illness:  Patient is an 82 year old female past oral history of hypertension and hypothyroidism and diastolic heart failure whose had a number of falls in the past week. Patient's daughter attributes this to being recently restarted back on pain medication. In the emergency room, no evidence of hip fracture, but she was noted to have superior and inferior pubic rami fractures.  Hospital Course:  Principal Problem:   Fracture of pubic ramus [facility]): Patient seen by orthopedic surgery who  recommended nonoperative physical therapy and rehabilitation. Seen by physical therapy who agreed patient needs skilled nursing Active Problems:   Essential hypertension: Stable once and controlled   Aortic valve disorder   Acute renal failure superimposed on stage 4 chronic kidney disease (HCC) colon secondary to mild dehydration, resolved with IV fluids   Fall   Chronic diastolic congestive heart failure (HCC)   Pelvic ring fracture (HCC)   Procedures:  None  Consultations:  Orthopedic surgery   Discharge Exam: BP 120/38 mmHg  Pulse 92  Temp(Src) 98.7 F (37.1 C) (Oral)  Resp 18  Wt 73.029 kg (161 lb)  SpO2 92%  General: Alert and oriented 2, no acute distress Cardiovascular: Regular rate and rhythm, S1-S2  Respiratory: Clear to auscultation bilaterally   Discharge Instructions You were cared for by a hospitalist during your hospital stay. If you have any questions about your discharge medications or the care you received while you were in the hospital after you are discharged, you can call the unit and asked to speak with the hospitalist on call if the hospitalist that took care of you is not available. Once you are discharged, your primary care physician will handle any further medical issues. Please note that NO REFILLS for any discharge medications will be authorized once you are discharged, as it is imperative that you return to your primary care physician (or establish a relationship with a primary care physician if you do not have one) for your aftercare needs so that they can reassess your need for medications and monitor your lab values.     Medication List    TAKE these medications  aspirin 81 MG chewable tablet  Chew 81 mg by mouth daily.     CALCITRIOL PO  Take 1 tablet by mouth daily. Take on Mon wed and fridays     FLUoxetine 40 MG capsule  Commonly known as:  PROZAC  Take 40 mg by mouth daily.     furosemide 40 MG tablet  Commonly known as:   LASIX  Take 1 tablet (40 mg total) by mouth daily.     levothyroxine 25 MCG tablet  Commonly known as:  SYNTHROID, LEVOTHROID  Take 25 mcg by mouth daily.     losartan 25 MG tablet  Commonly known as:  COZAAR  Take 25 mg by mouth daily.     morphine 15 MG tablet  Commonly known as:  MSIR  Take 1 tablet (15 mg total) by mouth every 8 (eight) hours as needed for severe pain.     PRESERVISION AREDS 2 PO  Take 1 capsule by mouth 2 (two) times daily.     temazepam 15 MG capsule  Commonly known as:  RESTORIL  Take 15 mg by mouth at bedtime.       Allergies  Allergen Reactions  . Ciprofloxacin Rash  . Sulfa Antibiotics Rash       Follow-up Information    Follow up with DUDA,MARCUS V, MD In 3 weeks.   Specialty:  Orthopedic Surgery   Contact information:   [address] [place] [address] [phone]        The results of significant diagnostics from this hospitalization (including imaging, microbiology, ancillary and laboratory) are listed below for reference.    Significant Diagnostic Studies: Dg Hip Unilat  With Pelvis 2-3 Views Right  06/24/2015  CLINICAL DATA:  Larey Seat at home today.  Hip pain. EXAM: DG HIP (WITH OR WITHOUT PELVIS) 2-3V RIGHT COMPARISON:  None. FINDINGS: There is no evidence of hip fracture or dislocation. There is a comminuted fracture of the right inferior and superior pubic rami. There are degenerative changes of bilateral SI joints. IMPRESSION: Comminuted fractures of the right superior and inferior pubic rami. Electronically Signed   By: Elige Ko   On: 06/24/2015 18:51    Microbiology: No results found for this or any previous visit (from the past 240 hour(s)).   Labs: Basic Metabolic Panel:  Recent Labs Lab 06/24/15 1758 06/25/15 0650  NA 137 139  K 4.6 4.4  CL 99* 100*  CO2 26 30  GLUCOSE 174* 109*  BUN 70* 70*  CREATININE 2.37* 2.18*  CALCIUM 9.6 9.0   Liver Function Tests:  Recent Labs Lab 06/24/15 1758  AST 30   ALT 20  ALKPHOS 76  BILITOT 0.8  PROT 6.1*  ALBUMIN 3.2*   No results for input(s): LIPASE, AMYLASE in the last 168 hours. No results for input(s): AMMONIA in the last 168 hours. CBC:  Recent Labs Lab 06/24/15 1758 06/25/15 0650  WBC 10.8* 6.7  NEUTROABS 9.6*  --   HGB 10.2* 9.0*  HCT 32.0* 27.1*  MCV 90.7 90.6  PLT 193 138*   Cardiac Enzymes: No results for input(s): CKTOTAL, CKMB, CKMBINDEX, TROPONINI in the last 168 hours. BNP: BNP (last 3 results)  Recent Labs  06/25/15 0650  BNP 247.2*    ProBNP (last 3 results) No results for input(s): PROBNP in the last 8760 hours.  CBG:  Recent Labs Lab 06/25/15 0733 06/25/15 1610  GLUCAP 92 121*       Signed:  Jaiana Sheffer K  [facility] 06/25/2015,  7:00 PM

## 2015-06-25 NOTE — Consult Note (Signed)
Reason for Consult: Inferior and superior right pubic rami fractures Referring Physician: Dr. Joelyn Oms is an 82 y.o. female.  HPI: Patient is a 82 year old woman who had a ground-level fall sustaining a pubic rami fractures on the right.  Past Medical History  Diagnosis Date  . CHF (congestive heart failure) ([place])   . Hypertension   . Depression   . Hypothyroidism   . Chronic back pain   . Headache(784.0)   . Bronchitis   . Heart valve disease   . Renal disorder     Past Surgical History  Procedure Laterality Date  . Tonsillectomy    . Abdominal hysterectomy    . Hernia repair      Family History  Problem Relation Age of Onset  . Stroke Mother   . Lung cancer Father     Social History:  reports that she has never smoked. She has never used smokeless tobacco. She reports that she does not drink alcohol or use illicit drugs.  Allergies:  Allergies  Allergen Reactions  . Ciprofloxacin Rash  . Sulfa Antibiotics Rash    Medications: I have reviewed the patient's current medications.  Results for orders placed or performed during the hospital encounter of 06/24/15 (from the past 48 hour(s))  CBC with Differential     Status: Abnormal   Collection Time: 06/24/15  5:58 PM  Result Value Ref Range   WBC 10.8 (H) 4.0 - 10.5 K/uL   RBC 3.53 (L) 3.87 - 5.11 MIL/uL   Hemoglobin 10.2 (L) 12.0 - 15.0 g/dL   HCT 32.0 (L) 36.0 - 46.0 %   MCV 90.7 78.0 - 100.0 fL   MCH 28.9 26.0 - 34.0 pg   MCHC 31.9 30.0 - 36.0 g/dL   RDW 13.5 11.5 - 15.5 %   Platelets 193 150 - 400 K/uL   Neutrophils Relative % 89 %   Neutro Abs 9.6 (H) 1.7 - 7.7 K/uL   Lymphocytes Relative 5 %   Lymphs Abs 0.6 (L) 0.7 - 4.0 K/uL   Monocytes Relative 6 %   Monocytes Absolute 0.6 0.1 - 1.0 K/uL   Eosinophils Relative 0 %   Eosinophils Absolute 0.0 0.0 - 0.7 K/uL   Basophils Relative 0 %   Basophils Absolute 0.0 0.0 - 0.1 K/uL  Comprehensive metabolic panel     Status: Abnormal   Collection Time: 06/24/15  5:58 PM  Result Value Ref Range   Sodium 137 135 - 145 mmol/L   Potassium 4.6 3.5 - 5.1 mmol/L   Chloride 99 (L) 101 - 111 mmol/L   CO2 26 22 - 32 mmol/L   Glucose, Bld 174 (H) 65 - 99 mg/dL   BUN 70 (H) 6 - 20 mg/dL   Creatinine, Ser 2.37 (H) 0.44 - 1.00 mg/dL   Calcium 9.6 8.9 - 10.3 mg/dL   Total Protein 6.1 (L) 6.5 - 8.1 g/dL   Albumin 3.2 (L) 3.5 - 5.0 g/dL   AST 30 15 - 41 U/L   ALT 20 14 - 54 U/L   Alkaline Phosphatase 76 38 - 126 U/L   Total Bilirubin 0.8 0.3 - 1.2 mg/dL   GFR calc non Af Amer 18 (L) >60 mL/min   GFR calc Af Amer 20 (L) >60 mL/min    Comment: (NOTE) The eGFR has been calculated using the CKD EPI equation. This calculation has not been validated in all clinical situations. eGFR's persistently <60 mL/min signify possible Chronic Kidney Disease.  Anion gap 12 5 - 15  Type and screen [place]     Status: None   Collection Time: 06/24/15  9:33 PM  Result Value Ref Range   ABO/RH(D) O NEG    Antibody Screen NEG    Sample Expiration 06/27/2015   ABO/Rh     Status: None   Collection Time: 06/24/15  9:33 PM  Result Value Ref Range   ABO/RH(D) O NEG   Protime-INR     Status: Abnormal   Collection Time: 06/24/15  9:50 PM  Result Value Ref Range   Prothrombin Time 15.9 (H) 11.6 - 15.2 seconds   INR 1.26 0.00 - 1.49  APTT     Status: None   Collection Time: 06/24/15  9:50 PM  Result Value Ref Range   aPTT 31 24 - 37 seconds    Dg Hip Unilat  With Pelvis 2-3 Views Right  06/24/2015  CLINICAL DATA:  [place] at home today.  Hip pain. EXAM: DG HIP (WITH OR WITHOUT PELVIS) 2-3V RIGHT COMPARISON:  None. FINDINGS: There is no evidence of hip fracture or dislocation. There is a comminuted fracture of the right inferior and superior pubic rami. There are degenerative changes of bilateral SI joints. IMPRESSION: Comminuted fractures of the right superior and inferior pubic rami. Electronically Signed   By: Kathreen Devoid   On:  06/24/2015 18:51    Review of Systems  All other systems reviewed and are negative.  Blood pressure 130/48, pulse 91, temperature 98.7 F (37.1 C), temperature source Oral, resp. rate 17, SpO2 99 %. Physical Exam On examination patient is resting comfortably. There are no rotational malalignments with both lower extremities. Review of the radiographs shows a comminuted fracture of the right superior and inferior pubic rami fractures. Assessment/Plan: Assessment: Comminuted right inferior and superior pubic rami fractures.  Plan: Patient anticipate discharge to skilled nursing she may be weightbearing as tolerated on the right lower extremity. I will follow-up as an outpatient.  Gershon Shorten V 06/25/2015, 7:00 AM

## 2015-06-25 NOTE — NC FL2 (Signed)
[place] MEDICAID FL2 LEVEL OF CARE SCREENING TOOL     IDENTIFICATION  Patient Name: Julie Mclean Birthdate: 1927/08/28 Sex: female Admission Date (Current Location): 06/24/2015  [facility] and [place] Number:  Producer, television/film/video and Address:  [facility] [place], [address], [place], [place] [address]      Provider Number: [id number]  Attending Physician Name and Address:  Hollice Espy, MD  Relative Name and Phone Number:       Current Level of Care: Hospital Recommended Level of Care: Skilled Nursing Facility Prior Approval Number:    Date Approved/Denied:   PASRR Number:    Discharge Plan: SNF    Current Diagnoses: Patient Active Problem List   Diagnosis Date Noted  . Chronic diastolic congestive heart failure (HCC) 06/25/2015  . Pelvic ring fracture (HCC) 06/25/2015  . Fracture of pubic ramus (HCC) 06/24/2015  . Fall 06/24/2015  . Weight loss 12/31/2014  . Transaminitis 06/03/2011  . Hypotension 06/01/2011  . Diverticulitis 06/01/2011  . Acute renal failure superimposed on stage 4 chronic kidney disease (HCC) 06/01/2011  . UTI (lower urinary tract infection) 06/01/2011  . BRONCHITIS 09/13/2007  . MACULAR DEGENERATION 09/08/2007  . DEAFNESS 09/08/2007  . Essential hypertension 09/08/2007  . Aortic valve disorder 09/08/2007  . ALLERGIC RHINITIS 09/08/2007  . HEADACHE, CHRONIC 09/08/2007  . History of cardiovascular disorder 09/08/2007  . DYSPNEA 08/14/2007    Orientation RESPIRATION BLADDER Height & Weight     Self, Time, Situation, Place  Normal Continent Weight:   Height:     BEHAVIORAL SYMPTOMS/MOOD NEUROLOGICAL BOWEL NUTRITION STATUS      Continent Diet (Please check discharge summary for dietary needs)  AMBULATORY STATUS COMMUNICATION OF NEEDS Skin   Extensive Assist Verbally Surgical wounds                       Personal Care Assistance Level of Assistance  Bathing, Dressing Bathing Assistance: Limited  assistance Feeding assistance: Independent Dressing Assistance: Maximum assistance     Functional Limitations Info             SPECIAL CARE FACTORS FREQUENCY  OT (By licensed OT), PT (By licensed PT)     PT Frequency: daily OT Frequency: daily            Contractures Contractures Info: Present    Additional Factors Info  Code Status, Allergies Code Status Info: DNR Allergies Info: Ciprofloxacin, Sulfa antibiotics           Current Medications (06/25/2015):  This is the current hospital active medication list Current Facility-Administered Medications  Medication Dose Route Frequency Provider Last Rate Last Dose  . 0.9 %  sodium chloride infusion   Intravenous Continuous Lorretta Harp, MD 50 mL/hr at 06/24/15 2115 50 mL/hr at 06/24/15 2115  . acetaminophen (TYLENOL) tablet 650 mg  650 mg Oral Q6H PRN Lorretta Harp, MD       Or  . acetaminophen (TYLENOL) suppository 650 mg  650 mg Rectal Q6H PRN Lorretta Harp, MD      . aspirin chewable tablet 81 mg  81 mg Oral Daily Lorretta Harp, MD   81 mg at 06/25/15 0924  . calcitRIOL (ROCALTROL) capsule 0.25 mcg  0.25 mcg Oral Q M,W,F Lorretta Harp, MD   0.25 mcg at 06/24/15 2214  . fentaNYL (SUBLIMAZE) injection 12.5 mcg  12.5 mcg Intravenous Q1H PRN Nelva Nay, MD      . levothyroxine (SYNTHROID, LEVOTHROID) tablet 25 mcg  25 mcg Oral QAC breakfast Lorretta Harp, MD   25 mcg at 06/25/15 [phone]  . morphine (MSIR) tablet 15 mg  15 mg Oral Q8H Hollice Espy, MD      . multivitamin (PROSIGHT) tablet 1 tablet  1 tablet Oral Daily Scarlett Presto, [facility]   1 tablet at 06/25/15 9604  . oxyCODONE-acetaminophen (PERCOCET/ROXICET) 5-325 MG per tablet 1 tablet  1 tablet Oral Q6H PRN Lorretta Harp, MD   1 tablet at 06/24/15 2214  . sodium chloride flush (NS) 0.9 % injection 3 mL  3 mL Intravenous Q12H Lorretta Harp, MD   3 mL at 06/25/15 1000  . temazepam (RESTORIL) capsule 15 mg  15 mg Oral QHS Lorretta Harp, MD   15 mg at 06/24/15 2214     Discharge Medications: Please  see discharge summary for a list of discharge medications.  Relevant Imaging Results:  Relevant Lab Results:   Additional Information SSN: [id number] Patient is ready to discharge today. Thanks!  Rondel Baton, LCSW

## 2015-06-25 NOTE — ED Provider Notes (Signed)
CSN: [id number]     Arrival date & time 06/24/15  1709 History   First MD Initiated Contact with Patient 06/24/15 1837     Chief Complaint  Patient presents with  . Fall  . Hip Pain      HPI Pt here for right hip pain after fall. sts she has fell 3 times the past week. sts vision is off. sts everything is foggy. sts recently started taking her morphine again. sts abd issues and has been on abx. Pupils [place].  Past Medical History  Diagnosis Date  . CHF (congestive heart failure) (HCC)   . Hypertension   . Depression   . Hypothyroidism   . Chronic back pain   . Headache(784.0)   . Bronchitis   . Heart valve disease   . Renal disorder    Past Surgical History  Procedure Laterality Date  . Tonsillectomy    . Abdominal hysterectomy    . Hernia repair     Family History  Problem Relation Age of Onset  . Stroke Mother   . Lung cancer Father    Social History  Substance Use Topics  . Smoking status: Never Smoker   . Smokeless tobacco: Never Used  . Alcohol Use: No   OB History    No data available     Review of Systems  All other systems reviewed and are negative.     Allergies  Ciprofloxacin and Sulfa antibiotics  Home Medications   Prior to Admission medications   Medication Sig Start Date End Date Taking? Authorizing Provider  aspirin 81 MG chewable tablet Chew 81 mg by mouth daily.   Yes Historical Provider, MD  CALCITRIOL PO Take 1 tablet by mouth daily. Take on Mon wed and fridays   Yes Historical Provider, MD  FLUoxetine (PROZAC) 40 MG capsule Take 40 mg by mouth daily.   Yes Historical Provider, MD  furosemide (LASIX) 40 MG tablet Take 1 tablet (40 mg total) by mouth daily. 06/08/11  Yes Elease Etienne, MD  levothyroxine (SYNTHROID, LEVOTHROID) 25 MCG tablet Take 25 mcg by mouth daily. 02/27/13  Yes Historical Provider, MD  losartan (COZAAR) 25 MG tablet Take 25 mg by mouth daily.   Yes Historical Provider, MD  morphine (MSIR) 15 MG tablet Take 15  mg by mouth 3 (three) times daily with meals.    Yes Historical Provider, MD  Multiple Vitamins-Minerals (PRESERVISION AREDS 2 PO) Take 1 capsule by mouth 2 (two) times daily.   Yes Historical Provider, MD  temazepam (RESTORIL) 15 MG capsule Take 15 mg by mouth at bedtime.    Yes Historical Provider, MD  morphine (MSIR) 15 MG tablet Take 1 tablet (15 mg total) by mouth every 8 (eight) hours as needed for severe pain. 06/25/15   Hollice Espy, MD   BP 118/44 mmHg  Pulse 83  Temp(Src) 98.9 F (37.2 C) (Oral)  Resp 16  Wt 161 lb (73.029 kg)  SpO2 97% Physical Exam  Constitutional: She is oriented to person, place, and time. She appears well-developed and well-nourished. No distress.  HENT:  Head: Normocephalic and atraumatic.  Eyes: Pupils are equal, round, and reactive to light.  Neck: Normal range of motion.  Cardiovascular: Normal rate and intact distal pulses.   Pulmonary/Chest: No respiratory distress.  Abdominal: Normal appearance. She exhibits no distension.  Musculoskeletal: She exhibits tenderness.       Right hip: She exhibits decreased range of motion and tenderness.  Legs: Neurological: She is alert and oriented to person, place, and time. No cranial nerve deficit.  Skin: Skin is warm and dry. No rash noted.  Psychiatric: She has a normal mood and affect. Her behavior is normal.  Nursing note and vitals reviewed.   ED Course  Procedures (including critical care time) Labs Review   Imaging Review Dg Hip Unilat  With Pelvis 2-3 Views Right  06/24/2015  CLINICAL DATA:  [place] at home today.  Hip pain. EXAM: DG HIP (WITH OR WITHOUT PELVIS) 2-3V RIGHT COMPARISON:  None. FINDINGS: There is no evidence of hip fracture or dislocation. There is a comminuted fracture of the right inferior and superior pubic rami. There are degenerative changes of bilateral SI joints. IMPRESSION: Comminuted fractures of the right superior and inferior pubic rami. Electronically Signed   By:  Elige Ko   On: 06/24/2015 18:51   I have personally reviewed and evaluated these images and lab results as part of my medical decision-making.  I discussed with the orthopedic doctor who will see on the floor.  Patient unable walk.  Patient was admitted to the hospital service.  MDM   Final diagnoses:  Closed fracture of pelvis, unspecified part of pelvis, initial encounter (HCC)        Nelva Nay, MD 06/25/15 2258

## 2015-06-25 NOTE — Care Management (Signed)
Case manager spoke with patient's daughterAugust Luz concerning discharge plans. She states her mom will need to go to SNF for shortterm rehab. Case manager notified assigned Child psychotherapist. Will continue to monitor.

## 2015-06-25 NOTE — Progress Notes (Signed)
Daughter states mom has been on morphine 15 mg daily and needs that she is in a lot of pain.  Called Dr Rito Ehrlich morphine 15 mg ordered

## 2015-06-25 NOTE — Progress Notes (Signed)
OT Cancellation Note  Patient Details Name: Julie Mclean MRN: [id number] DOB: [birth year]   Cancelled Treatment:    Reason Eval/Treat Not Completed: Other (comment) (Pt planning d/c to SNF; defer OT to next venue.) Please re-consult if OT eval needed for d/c planning.   Gaye Alken M.S., OTR/L Pager: [phone]  06/25/2015, 3:15 PM

## 2015-06-26 DIAGNOSIS — I10 Essential (primary) hypertension: Secondary | ICD-10-CM | POA: Diagnosis not present

## 2015-06-26 DIAGNOSIS — N179 Acute kidney failure, unspecified: Secondary | ICD-10-CM | POA: Diagnosis not present

## 2015-06-26 DIAGNOSIS — I5032 Chronic diastolic (congestive) heart failure: Secondary | ICD-10-CM | POA: Diagnosis not present

## 2015-06-26 DIAGNOSIS — S32501D Unspecified fracture of right pubis, subsequent encounter for fracture with routine healing: Secondary | ICD-10-CM | POA: Diagnosis not present

## 2015-06-26 LAB — GLUCOSE, CAPILLARY: Glucose-Capillary: 112 mg/dL — ABNORMAL HIGH (ref 65–99)

## 2015-06-26 NOTE — Discharge Summary (Signed)
Discharge Summary  Julie Mclean:[id number] DOB: 1928/05/12  PCP: Mickie Hillier, MD  Admit date: 06/24/2015 Anticipated Discharge date: 06/26/2015  Time spent: 25 minutes  Recommendations for Outpatient Follow-up:  1. Medication clarification: Patient had recently restarted on morphine sulfate instant release 15 mg by mouth 3 times a day. In discussion with her daughter, there was concern that the medication was causing her to be somewhat drowsy and may have contributed to her fall. The symptom, patient was having active pain. We will do compromise and agree that she'll be discharged on morphine sulfate every 8 hours as needed rather than scheduled 2. Patient being discharged to skilled nursing facility   Discharge Diagnoses:  Active Hospital Problems   Diagnosis Date Noted  . Fracture of pubic ramus (HCC) 06/24/2015  . Chronic diastolic congestive heart failure (HCC) 06/25/2015  . Pelvic ring fracture (HCC) 06/25/2015  . Fall 06/24/2015  . Acute renal failure superimposed on stage 4 chronic kidney disease (HCC) 06/01/2011  . Essential hypertension 09/08/2007  . Aortic valve disorder 09/08/2007    Resolved Hospital Problems   Diagnosis Date Noted Date Resolved  No resolved problems to display.    Discharge Condition: Improved, being discharged to skilled nursing facility  Diet recommendation: Heart healthy   Filed Weights   06/25/15 1309 06/26/15 0705  Weight: 73.029 kg (161 lb) 72.6 kg (160 lb 0.9 oz)    History of present illness:  Patient is an 82 year old female past oral history of hypertension and hypothyroidism and diastolic heart failure whose had a number of falls in the past week. Patient's daughter attributes this to being recently restarted back on pain medication. In the emergency room, no evidence of hip fracture, but she was noted to have superior and inferior pubic rami fractures.  Hospital Course:  Principal Problem:   Fracture of pubic ramus [facility]):  Patient seen by orthopedic surgery who recommended nonoperative physical therapy and rehabilitation. Seen by physical therapy who agreed patient needs skilled nursing Active Problems:   Essential hypertension: Stable once and controlled   Aortic valve disorder   Acute renal failure superimposed on stage 4 chronic kidney disease (HCC) colon secondary to mild dehydration, resolved with IV fluids   Fall   Chronic diastolic congestive heart failure (HCC)   Pelvic ring fracture (HCC)   Procedures:  None  Consultations:  Orthopedic surgery   Discharge Exam: BP 120/46 mmHg  Pulse 92  Temp(Src) 98.6 F (37 C) (Oral)  Resp 18  Wt 72.6 kg (160 lb 0.9 oz)  SpO2 95%  General: Alert and oriented 2, no acute distress Cardiovascular: Regular rate and rhythm, S1-S2  Respiratory: Clear to auscultation bilaterally   Discharge Instructions You were cared for by a hospitalist during your hospital stay. If you have any questions about your discharge medications or the care you received while you were in the hospital after you are discharged, you can call the unit and asked to speak with the hospitalist on call if the hospitalist that took care of you is not available. Once you are discharged, your primary care physician will handle any further medical issues. Please note that NO REFILLS for any discharge medications will be authorized once you are discharged, as it is imperative that you return to your primary care physician (or establish a relationship with a primary care physician if you do not have one) for your aftercare needs so that they can reassess your need for medications and monitor your lab values.  Discharge Instructions    Diet - low sodium heart healthy    Complete by:  As directed      Increase activity slowly    Complete by:  As directed             Medication List    TAKE these medications        aspirin 81 MG chewable tablet  Chew 81 mg by mouth daily.      CALCITRIOL PO  Take 1 tablet by mouth daily. Take on Mon wed and fridays     FLUoxetine 40 MG capsule  Commonly known as:  PROZAC  Take 40 mg by mouth daily.     furosemide 40 MG tablet  Commonly known as:  LASIX  Take 1 tablet (40 mg total) by mouth daily.     levothyroxine 25 MCG tablet  Commonly known as:  SYNTHROID, LEVOTHROID  Take 25 mcg by mouth daily.     losartan 25 MG tablet  Commonly known as:  COZAAR  Take 25 mg by mouth daily.     morphine 15 MG tablet  Commonly known as:  MSIR  Take 1 tablet (15 mg total) by mouth every 8 (eight) hours as needed for severe pain.     PRESERVISION AREDS 2 PO  Take 1 capsule by mouth 2 (two) times daily.     temazepam 15 MG capsule  Commonly known as:  RESTORIL  Take 15 mg by mouth at bedtime.       Allergies  Allergen Reactions  . Ciprofloxacin Rash  . Sulfa Antibiotics Rash   Follow-up Information    Follow up with DUDA,MARCUS V, MD In 3 weeks.   Specialty:  Orthopedic Surgery   Contact information:   [address] [place] [address] [place] [address] [phone]        The results of significant diagnostics from this hospitalization (including imaging, microbiology, ancillary and laboratory) are listed below for reference.    Significant Diagnostic Studies: Dg Hip Unilat  With Pelvis 2-3 Views Right  06/24/2015  CLINICAL DATA:  [place] at home today.  Hip pain. EXAM: DG HIP (WITH OR WITHOUT PELVIS) 2-3V RIGHT COMPARISON:  None. FINDINGS: There is no evidence of hip fracture or dislocation. There is a comminuted fracture of the right inferior and superior pubic rami. There are degenerative changes of bilateral SI joints. IMPRESSION: Comminuted fractures of the right superior and inferior pubic rami. Electronically Signed   By: Elige Ko   On: 06/24/2015 18:51    Microbiology: No results found for this or any previous visit (from the past 240 hour(s)).   Labs: Basic Metabolic Panel:  Recent Labs Lab 06/24/15 1758  06/25/15 0650  NA 137 139  K 4.6 4.4  CL 99* 100*  CO2 26 30  GLUCOSE 174* 109*  BUN 70* 70*  CREATININE 2.37* 2.18*  CALCIUM 9.6 9.0   Liver Function Tests:  Recent Labs Lab 06/24/15 1758  AST 30  ALT 20  ALKPHOS 76  BILITOT 0.8  PROT 6.1*  ALBUMIN 3.2*   No results for input(s): LIPASE, AMYLASE in the last 168 hours. No results for input(s): AMMONIA in the last 168 hours. CBC:  Recent Labs Lab 06/24/15 1758 06/25/15 0650  WBC 10.8* 6.7  NEUTROABS 9.6*  --   HGB 10.2* 9.0*  HCT 32.0* 27.1*  MCV 90.7 90.6  PLT 193 138*   Cardiac Enzymes: No results for input(s): CKTOTAL, CKMB, CKMBINDEX, TROPONINI in the last 168 hours. BNP:  BNP (last 3 results)  Recent Labs  06/25/15 0650  BNP 247.2*    ProBNP (last 3 results) No results for input(s): PROBNP in the last 8760 hours.  CBG:  Recent Labs Lab 06/25/15 0733 06/25/15 1610 06/26/15 0714  GLUCAP 92 121* 112*       Signed:  Nikolaos Maddocks K  [facility] 06/26/2015, 8:57 AM

## 2015-06-26 NOTE — Clinical Social Work Note (Signed)
CSW spoke with patient's daughter to discuss patient discharge.  Daughter has chosen [employer] and will sign paperwork at 1:30pm.  Patient will discharge today per MD order. Patient will discharge to: [facility] RN to call report prior to transportation to: [phone] Transportation: [facility]- to be called at 3:30pm  CSW sent discharge summary to SNF for review.  Packet is complete.  RN, patient and family aware of discharge plans.  Vickii Penna, LCSW [phone]  5N1-9, 2S 15-16 and Psychiatric Service Line  Licensed Clinical Social Worker

## 2015-06-26 NOTE — Progress Notes (Signed)
Tried to call report to [facility] twice no staff to take report picked up

## 2015-06-26 NOTE — Care Management Obs Status (Signed)
MEDICARE OBSERVATION STATUS NOTIFICATION   Patient Details  Name: Julie Mclean MRN: [id number] Date of Birth: 1927/11/21   Medicare Observation Status Notification Given:  Yes    Durenda Guthrie, RN 06/26/2015, 10:09 AM

## 2015-06-26 NOTE — Progress Notes (Signed)
Addendum to Physical Therapy note from 07/25/15    07/25/2015 1007  PT G-Codes **NOT FOR INPATIENT CLASS**  Functional Assessment Tool Used clinical judgement  Functional Limitation Mobility: Walking and moving around  Mobility: Walking and Moving Around Current Status [phone]) CK  Mobility: Walking and Moving Around Goal Status [phone]) CJ  Delaney Meigs, PT [phone]

## 2015-08-07 ENCOUNTER — Encounter: Payer: Self-pay | Admitting: Interventional Cardiology

## 2015-08-07 ENCOUNTER — Ambulatory Visit (INDEPENDENT_AMBULATORY_CARE_PROVIDER_SITE_OTHER): Payer: Medicare Other | Admitting: Interventional Cardiology

## 2015-08-07 VITALS — BP 122/56 | HR 66 | Ht 62.0 in | Wt 128.8 lb

## 2015-08-07 DIAGNOSIS — I10 Essential (primary) hypertension: Secondary | ICD-10-CM

## 2015-08-07 DIAGNOSIS — N183 Chronic kidney disease, stage 3 (moderate): Secondary | ICD-10-CM

## 2015-08-07 DIAGNOSIS — I359 Nonrheumatic aortic valve disorder, unspecified: Secondary | ICD-10-CM

## 2015-08-07 NOTE — Patient Instructions (Addendum)
Medication Instructions:   Your physician recommends that you continue on your current medications as directed. Please refer to the Current Medication list given to you today.   If you need a refill on your cardiac medications before your next appointment, please call your pharmacy.  Labwork:  NONE ORDER TODAY    Testing/Procedures:  NONE ORDER TODAY    Follow-Up:  Your physician wants you to follow-up in: ONE YEAR WITH  DR Marlou StarksSMITH You will receive a reminder letter in the mail two months in advance. If you don't receive a letter, please call our office to schedule the follow-up appointment.     Any Other Special Instructions Will Be Listed Below (If Applicable).                                                                                                                                                  Medication Instructions:     If you need a refill on your cardiac medications before your next appointment, please call your pharmacy.  Labwork:   Testing/Procedures:   Follow-Up:   Any Other Special Instructions Will Be Listed Below (If Applicable).

## 2015-08-07 NOTE — Progress Notes (Signed)
Cardiology Office Note   Date:  08/07/2015   ID:  Julie Mclean, DOB 09/09/1927, MRN [id number]  PCP:  Mickie Hillier, MD  Cardiologist:  Lesleigh Noe, MD   Chief Complaint  Patient presents with  . Cardiac Valve Problem      History of Present Illness: Julie Mclean is a 82 y.o. female who presents for Moderate to severe aortic stenosis, chronic diastolic heart failure, history of hypertension, and increasing frailty with aging.  Recent hospital stay after falling and suffering a pelvic fracture. Diuretic therapy was discontinued during the hospital stay and rehabilitation. Upon returning home she became very short of breath and furosemide was required. Back on furosemide her breathing is markedly improved. She is accompanied today by her daughter. Today there are no complaints of dyspnea, chest discomfort, and she has not had syncope. Recent falls have not been associated with loss of consciousness. Recent falls a believe is related to restarting morphine sulfate which then caused sedation and imbalance.    Past Medical History  Diagnosis Date  . CHF (congestive heart failure) (HCC)   . Hypertension   . Depression   . Hypothyroidism   . Chronic back pain   . Headache(784.0)   . Bronchitis   . Heart valve disease   . Renal disorder     Past Surgical History  Procedure Laterality Date  . Tonsillectomy    . Abdominal hysterectomy    . Hernia repair       Current Outpatient Prescriptions  Medication Sig Dispense Refill  . aspirin 81 MG chewable tablet Chew 81 mg by mouth daily.    Marland Kitchen FLUoxetine (PROZAC) 40 MG capsule Take 40 mg by mouth daily.    . furosemide (LASIX) 40 MG tablet Take 1 tablet (40 mg total) by mouth daily.    Marland Kitchen levothyroxine (SYNTHROID, LEVOTHROID) 25 MCG tablet Take 25 mcg by mouth daily.    Marland Kitchen losartan (COZAAR) 25 MG tablet Take 25 mg by mouth daily.    Marland Kitchen morphine (MSIR) 15 MG tablet Take 1 tablet (15 mg total) by mouth every 8 (eight) hours  as needed for severe pain. 30 tablet 0  . temazepam (RESTORIL) 15 MG capsule Take 15 mg by mouth at bedtime.      No current facility-administered medications for this visit.    Allergies:   Other; Sulfamethoxazole; Ciprofloxacin; and Sulfa antibiotics    Social History:  The patient  reports that she has never smoked. She has never used smokeless tobacco. She reports that she does not drink alcohol or use illicit drugs.   Family History:  The patient's family history includes Lung cancer in her father; Stroke in her mother.    ROS:  Please see the history of present illness.   Otherwise, review of systems are positive for Decreased hearing, frailty, weakness, and frequent falls..   All other systems are reviewed and negative.    PHYSICAL EXAM: VS:  BP 122/56 mmHg  Pulse 66  Ht  (1.575 m)  Wt 128 lb 12.8 oz (58.423 kg)  BMI 23.55 kg/m2  SpO2 98% , BMI Body mass index is 23.55 kg/(m^2). GEN: Well nourished, well developed, in no acute distress HEENT: normal Neck: no JVD, carotid bruits, or masses Cardiac: RRR.  There is no murmur, rub, or gallop. There is no edema. Respiratory:  clear to auscultation bilaterally, normal work of breathing. GI: soft, nontender, nondistended, + BS MS: no deformity or atrophy Skin: warm and dry,  no rash Neuro:  Strength and sensation are intact Psych: euthymic mood, full affect   EKG:  EKG is not ordered today.    Recent Labs: 06/24/2015: ALT 20 06/25/2015: B Natriuretic Peptide 247.2*; BUN 70*; Creatinine, Ser 2.18*; Hemoglobin 9.0*; Platelets 138*; Potassium 4.4; Sodium 139    Lipid Panel No results found for: CHOL, TRIG, HDL, CHOLHDL, VLDL, LDLCALC, LDLDIRECT    Wt Readings from Last 3 Encounters:  08/07/15 128 lb 12.8 oz (58.423 kg)  06/26/15 160 lb 0.9 oz (72.6 kg)  06/12/15 126 lb (57.153 kg)      Other studies Reviewed: Additional studies/ records that were reviewed today include: Recent hospital stay. The findings include  review of laboratory data and notation of worsened kidney function.    ASSESSMENT AND PLAN:  1. Aortic valve disorder Moderate to severe. She develops volume overload with dyspnea off diuretic therapy. Discussed with both the patient and daughter today and goal is medical therapy to control symptoms. There is no ultimate plan for valvular intervention either with SAVR or TAVR.  2. Essential hypertension Well controlled and perhaps relatively low for her age  303. CKD (chronic kidney disease), stage 4 (moderate) Stage IV chronic kidney disease with kidney function aggravated by both losartan and diuretic therapy. Losartan should probably be discontinued.  4. Chronic diastolic heart failure  Stable volume status  5. Frailty with frequent falls Recent pelvic fracture  Current medicines are reviewed at length with the patient today.  The patient has the following concerns regarding medicines: Needs diuretic therapy. Discussed this with the patient's family..  The following changes/actions have been instituted:    Clinical follow-up will be based on symptoms or in one year which ever comes first  Decision clarified today that no surgical or transvalvular aortic repair will occur. I treatment options will be with medication for pain radiation.  With developing/worsening kidney function, losartan therapy will need to be discontinued soon.  Labs/ tests ordered today include:  No orders of the defined types were placed in this encounter.     Disposition:   FU with HS in 1 year  Signed, Lesleigh NoeSMITH III,HENRY W, MD  08/07/2015 9:09 AM    [facility] [address], [place], [place]  [address] Phone: [phone](336) [phone]; Fax: [phone](336) [phone]

## 2016-06-10 ENCOUNTER — Telehealth: Payer: Self-pay | Admitting: Interventional Cardiology

## 2016-06-10 NOTE — Telephone Encounter (Signed)
New message:    Julie Mclean wants to know if pt has a diagnosis as Congested Heart Failure? If so,she needs to send the patient some scales.She also needs the patient last ejection fraction,heart rate and blood pressure please.

## 2016-06-10 NOTE — Telephone Encounter (Signed)
error 

## 2016-06-15 NOTE — Telephone Encounter (Signed)
Left voicemail with Lauren M RN, Nurse Case ManJanna Archager with [facility] asking her to fax over the EF % form-provided her with my fax and phone number here to medical records department.

## 2016-06-16 DIAGNOSIS — S329XXA Fracture of unspecified parts of lumbosacral spine and pelvis, initial encounter for closed fracture: Secondary | ICD-10-CM

## 2016-06-16 HISTORY — DX: Fracture of unspecified parts of lumbosacral spine and pelvis, initial encounter for closed fracture: S32.9XXA

## 2016-10-07 ENCOUNTER — Ambulatory Visit (INDEPENDENT_AMBULATORY_CARE_PROVIDER_SITE_OTHER): Payer: Medicare Other | Admitting: Interventional Cardiology

## 2016-10-07 ENCOUNTER — Encounter: Payer: Self-pay | Admitting: Interventional Cardiology

## 2016-10-07 VITALS — BP 150/64 | HR 68 | Ht 64.0 in | Wt 119.4 lb

## 2016-10-07 DIAGNOSIS — N184 Chronic kidney disease, stage 4 (severe): Secondary | ICD-10-CM

## 2016-10-07 DIAGNOSIS — I5032 Chronic diastolic (congestive) heart failure: Secondary | ICD-10-CM

## 2016-10-07 DIAGNOSIS — I10 Essential (primary) hypertension: Secondary | ICD-10-CM

## 2016-10-07 DIAGNOSIS — I359 Nonrheumatic aortic valve disorder, unspecified: Secondary | ICD-10-CM

## 2016-10-07 NOTE — Progress Notes (Signed)
Cardiology Office Note    Date:  10/07/2016   ID:  Julie Mclean, DOB 06-13-1927, MRN [id number]  PCP:  Catha Gosselin, MD  Cardiologist: Lesleigh Noe, MD   Chief Complaint  Patient presents with  . Cardiac Valve Problem    AS    History of Present Illness:  Julie Mclean is a 83 y.o. female who presents for moderate to severe aortic stenosis, chronic diastolic heart failure, history of hypertension, and increasing frailty with aging.  No cardiovascular complaints. Sedentary for the most part. No orthopnea. Denies chest pain, palpitations, syncope, and edema. She has no complaints. Appetite is stable although obviously losing some weight.   Past Medical History:  Diagnosis Date  . Bronchitis   . CHF (congestive heart failure) (HCC)   . Chronic back pain   . Depression   . Headache(784.0)   . Heart valve disease   . Hypertension   . Hypothyroidism   . Renal disorder     Past Surgical History:  Procedure Laterality Date  . ABDOMINAL HYSTERECTOMY    . HERNIA REPAIR    . TONSILLECTOMY      Current Medications: Outpatient Medications Prior to Visit  Medication Sig Dispense Refill  . aspirin 81 MG chewable tablet Chew 81 mg by mouth daily.    Marland Kitchen FLUoxetine (PROZAC) 40 MG capsule Take 40 mg by mouth daily.    . furosemide (LASIX) 40 MG tablet Take 1 tablet (40 mg total) by mouth daily.    Marland Kitchen levothyroxine (SYNTHROID, LEVOTHROID) 25 MCG tablet Take 25 mcg by mouth daily.    Marland Kitchen losartan (COZAAR) 25 MG tablet Take 25 mg by mouth daily.    Marland Kitchen morphine (MSIR) 15 MG tablet Take 1 tablet (15 mg total) by mouth every 8 (eight) hours as needed for severe pain. 30 tablet 0  . temazepam (RESTORIL) 15 MG capsule Take 15 mg by mouth at bedtime.      No facility-administered medications prior to visit.      Allergies:   Other; Sulfamethoxazole; Ciprofloxacin; and Sulfa antibiotics   Social History   Social History  . Marital status: Married    Spouse name: N/A  . Number of  children: N/A  . Years of education: N/A   Social History Main Topics  . Smoking status: Never Smoker  . Smokeless tobacco: Never Used  . Alcohol use No  . Drug use: No  . Sexual activity: Not Asked   Other Topics Concern  . None   Social History Narrative  . None     Family History:  The patient's family history includes Lung cancer in her father; Stroke in her mother.   ROS:   Please see the history of present illness.    Decreased appetite, unable to hear well enough to carry on easy conversation.  All other systems reviewed and are negative.   PHYSICAL EXAM:   VS:  BP (!) 150/64 (BP Location: Left Arm)   Pulse 68   Ht 5\' 4"  (1.626 m)   Wt 119 lb 6.4 oz (54.2 kg)   BMI 20.49 kg/m    GEN: Well nourished, well developed, in no acute distress  HEENT: normal  Neck: no JVD, or masses. Diminished carotid upstroke. Bilateral transmitted carotid bruits. Cardiac: RRR; rubs, or gallops,no edema .4/6 crescendo decrescendo systolic murmur of aortic stenosis. No regurgitation. Respiratory:  clear to auscultation bilaterally, normal work of breathing GI: soft, nontender, nondistended, + BS MS: no deformity or atrophy  Skin: warm and dry, no rash Neuro:  Alert and Oriented x 3, Strength and sensation are intact Psych: euthymic mood, full affect  Wt Readings from Last 3 Encounters:  10/07/16 119 lb 6.4 oz (54.2 kg)  08/07/15 128 lb 12.8 oz (58.4 kg)  06/26/15 160 lb 0.9 oz (72.6 kg)      Studies/Labs Reviewed:   EKG:  EKG  Not repeated  Recent Labs: No results found for requested labs within last 8760 hours.   Lipid Panel No results found for: CHOL, TRIG, HDL, CHOLHDL, VLDL, LDLCALC, LDLDIRECT  Additional studies/ records that were reviewed today include:  Echocardiogram 01/09/2015: Study Conclusions  - Left ventricle: The cavity size was normal. Wall thickness was   normal. Systolic function was normal. The estimated ejection   fraction was in the range of 55%  to 60%. Doppler parameters are   consistent with abnormal left ventricular relaxation (grade 1   diastolic dysfunction). - Aortic valve: There was moderate stenosis. There was mild to   moderate regurgitation. Valve area (VTI): 1.1 cm^2. Valve area   (Vmax): 1.07 cm^2. Valve area (Vmean): 1.1 cm^2. - Mitral valve: Calcified annulus. There was mild regurgitation. - Atrial septum: No defect or patent foramen ovale was identified.    ASSESSMENT:    1. Aortic valve disorder   2. Chronic diastolic congestive heart failure (HCC)   3. Essential hypertension   4. CKD (chronic kidney disease), stage IV (HCC)      PLAN:  In order of problems listed above:  1. No symptoms that I am able to uncover by history or exam. 2-D Doppler echocardiogram will be done to exclude progression to severe disease. With aging chronic kidney disease SAVR will not be reasonable. Perhaps TAVR could be done if needed. She is currently asymptomatic. 2. No evidence of volume overload. 3. Adequate blood pressure control given age. 4. Being followed by nephrology, Dr. Camille Balynthia Dunham.  Overall plan is to see the patient back in one year or if symptoms develop unless the echo demonstrates critical aortic stenosis. If critical aortic stenosis is found, I will have them back in to have a planning conversation concerning ultimate therapy.    Medication Adjustments/Labs and Tests Ordered: Current medicines are reviewed at length with the patient today.  Concerns regarding medicines are outlined above.  Medication changes, Labs and Tests ordered today are listed in the Patient Instructions below. There are no Patient Instructions on file for this visit.   Signed, Lesleigh NoeHenry W Smith III, MD  10/07/2016 4:28 PM    [facility] [address], [place], [place]  [address] Phone: [phone](336) [phone]; Fax: [phone](336) [phone]

## 2016-10-07 NOTE — Patient Instructions (Signed)
Medication Instructions:  None  Labwork: None  Testing/Procedures: Your physician has requested that you have an echocardiogram. Echocardiography is a painless test that uses sound waves to create images of your heart. It provides your doctor with information about the size and shape of your heart and how well your heart's chambers and valves are working. This procedure takes approximately one hour. There are no restrictions for this procedure.   Follow-Up: Your physician wants you to follow-up in: 1 year with Dr. Smith.  You will receive a reminder letter in the mail two months in advance. If you don't receive a letter, please call our office to schedule the follow-up appointment.   Any Other Special Instructions Will Be Listed Below (If Applicable).     If you need a refill on your cardiac medications before your next appointment, please call your pharmacy.   

## 2016-10-21 ENCOUNTER — Other Ambulatory Visit (HOSPITAL_COMMUNITY): Payer: Medicare Other

## 2016-10-27 ENCOUNTER — Telehealth (HOSPITAL_COMMUNITY): Payer: Self-pay | Admitting: Interventional Cardiology

## 2016-10-27 NOTE — Telephone Encounter (Signed)
You're welcome!

## 2016-10-27 NOTE — Telephone Encounter (Signed)
I called pt and spoke with Daughter Wilkie AyeKristy and she voiced that she needed to speak with the other dtr to coordinate a good date and time and she would call me back.

## 2016-10-27 NOTE — Telephone Encounter (Signed)
No problem. Thanks [employer].

## 2016-11-18 ENCOUNTER — Other Ambulatory Visit: Payer: Self-pay

## 2016-11-18 ENCOUNTER — Ambulatory Visit (HOSPITAL_COMMUNITY): Payer: Medicare Other | Attending: Cardiovascular Disease

## 2016-11-18 DIAGNOSIS — I700 Atherosclerosis of aorta: Secondary | ICD-10-CM | POA: Insufficient documentation

## 2016-11-18 DIAGNOSIS — I359 Nonrheumatic aortic valve disorder, unspecified: Secondary | ICD-10-CM

## 2016-11-18 DIAGNOSIS — I080 Rheumatic disorders of both mitral and aortic valves: Secondary | ICD-10-CM | POA: Diagnosis not present

## 2016-11-22 ENCOUNTER — Telehealth: Payer: Self-pay | Admitting: Interventional Cardiology

## 2016-11-22 NOTE — Telephone Encounter (Signed)
Now that she is asymptomatic, clinical observation only. She is due to come in to discuss her recent abnormal echocardiogram. When she comes for that visit we need to repeat in EKG. If recurrent chest discomfort she should report to the emergency room.

## 2016-11-22 NOTE — Telephone Encounter (Signed)
Spoke with pt and she asked that I speak with her husband.  Advised husband of recommendations per Dr. Katrinka BlazingSmith.  Husband verbalized understanding and was in agreement with this plan.

## 2016-11-22 NOTE — Telephone Encounter (Signed)
Received call from pt who is very hard of hearing.  She asked me to speak with her husband.  The pain was more of an ache that woke her from her sleep approximately 3 or 4 am today.  She reports no radiation but did have a little SOB, some nausea (was spitting up clear liquid) lasted about 1 to 2 hours and resolved on it's own after resting in the chair.  She denies any s/s at this time but wanted to make Dr Katrinka BlazingSmith aware.  Pt and husband are aware I will forward this information to Dr Katrinka BlazingSmith and his nurse for review and someone will call back with any further instructions.  Husband states understanding.

## 2016-11-22 NOTE — Telephone Encounter (Signed)
Pt c/o of Chest Pain: STAT if CP now or developed within 24 hours  1. Are you having CP right now? yes  2. Are you experiencing any other symptoms (ex. SOB, nausea, vomiting, sweating)? Sob and nausea 3. How long have you been experiencing CP? This morning  4. Is your CP continuous or coming and going? Coming and going   5. Have you taken Nitroglycerin? no ?

## 2017-04-10 ENCOUNTER — Emergency Department (HOSPITAL_COMMUNITY): Payer: Medicare Other

## 2017-04-10 ENCOUNTER — Encounter (HOSPITAL_COMMUNITY): Payer: Self-pay

## 2017-04-10 ENCOUNTER — Observation Stay (HOSPITAL_COMMUNITY)
Admission: EM | Admit: 2017-04-10 | Discharge: 2017-04-11 | Disposition: A | Discharge status: Home / Self Care | Payer: Medicare Other | Attending: Internal Medicine | Admitting: Internal Medicine

## 2017-04-10 ENCOUNTER — Other Ambulatory Visit: Payer: Self-pay

## 2017-04-10 DIAGNOSIS — G9340 Encephalopathy, unspecified: Secondary | ICD-10-CM | POA: Diagnosis not present

## 2017-04-10 DIAGNOSIS — Z801 Family history of malignant neoplasm of trachea, bronchus and lung: Secondary | ICD-10-CM | POA: Diagnosis not present

## 2017-04-10 DIAGNOSIS — N184 Chronic kidney disease, stage 4 (severe): Secondary | ICD-10-CM | POA: Diagnosis not present

## 2017-04-10 DIAGNOSIS — I5032 Chronic diastolic (congestive) heart failure: Secondary | ICD-10-CM | POA: Diagnosis not present

## 2017-04-10 DIAGNOSIS — Z79891 Long term (current) use of opiate analgesic: Secondary | ICD-10-CM | POA: Insufficient documentation

## 2017-04-10 DIAGNOSIS — Z79899 Other long term (current) drug therapy: Secondary | ICD-10-CM | POA: Insufficient documentation

## 2017-04-10 DIAGNOSIS — I10 Essential (primary) hypertension: Secondary | ICD-10-CM | POA: Diagnosis not present

## 2017-04-10 DIAGNOSIS — Z9181 History of falling: Secondary | ICD-10-CM | POA: Insufficient documentation

## 2017-04-10 DIAGNOSIS — Z882 Allergy status to sulfonamides status: Secondary | ICD-10-CM | POA: Diagnosis not present

## 2017-04-10 DIAGNOSIS — G894 Chronic pain syndrome: Secondary | ICD-10-CM | POA: Diagnosis not present

## 2017-04-10 DIAGNOSIS — W1809XA Striking against other object with subsequent fall, initial encounter: Secondary | ICD-10-CM | POA: Insufficient documentation

## 2017-04-10 DIAGNOSIS — Z7982 Long term (current) use of aspirin: Secondary | ICD-10-CM | POA: Diagnosis not present

## 2017-04-10 DIAGNOSIS — F329 Major depressive disorder, single episode, unspecified: Secondary | ICD-10-CM | POA: Diagnosis not present

## 2017-04-10 DIAGNOSIS — Z881 Allergy status to other antibiotic agents status: Secondary | ICD-10-CM | POA: Insufficient documentation

## 2017-04-10 DIAGNOSIS — E049 Nontoxic goiter, unspecified: Secondary | ICD-10-CM | POA: Diagnosis present

## 2017-04-10 DIAGNOSIS — I130 Hypertensive heart and chronic kidney disease with heart failure and stage 1 through stage 4 chronic kidney disease, or unspecified chronic kidney disease: Secondary | ICD-10-CM | POA: Diagnosis not present

## 2017-04-10 DIAGNOSIS — N390 Urinary tract infection, site not specified: Secondary | ICD-10-CM | POA: Diagnosis not present

## 2017-04-10 DIAGNOSIS — Z823 Family history of stroke: Secondary | ICD-10-CM | POA: Diagnosis not present

## 2017-04-10 DIAGNOSIS — G9341 Metabolic encephalopathy: Secondary | ICD-10-CM | POA: Diagnosis not present

## 2017-04-10 HISTORY — DX: Fracture of unspecified parts of lumbosacral spine and pelvis, initial encounter for closed fracture: S32.9XXA

## 2017-04-10 LAB — CREATININE, SERUM
Creatinine, Ser: 1.83 mg/dL — ABNORMAL HIGH (ref 0.44–1.00)
GFR calc Af Amer: 27 mL/min — ABNORMAL LOW (ref >60)
GFR calc non Af Amer: 24 mL/min — ABNORMAL LOW (ref >60)

## 2017-04-10 LAB — URINALYSIS, ROUTINE W REFLEX MICROSCOPIC
Bilirubin Urine: NEGATIVE
GLUCOSE, UA: NEGATIVE mg/dL
Hgb urine dipstick: NEGATIVE
KETONES UR: NEGATIVE mg/dL
Nitrite: NEGATIVE
PH: 5.5 (ref 5.0–8.0)
Protein, ur: NEGATIVE mg/dL
Specific Gravity, Urine: 1.01 (ref 1.005–1.030)

## 2017-04-10 LAB — CBC WITH DIFFERENTIAL/PLATELET
Basophils Absolute: 0 10*3/uL (ref 0.0–0.1)
Basophils Relative: 1 %
EOS ABS: 0.2 10*3/uL (ref 0.0–0.7)
EOS PCT: 4 %
HCT: 37.8 % (ref 36.0–46.0)
HEMOGLOBIN: 11.6 g/dL — AB (ref 12.0–15.0)
LYMPHS ABS: 1 10*3/uL (ref 0.7–4.0)
LYMPHS PCT: 16 %
MCH: 28.4 pg (ref 26.0–34.0)
MCHC: 30.7 g/dL (ref 30.0–36.0)
MCV: 92.6 fL (ref 78.0–100.0)
MONOS PCT: 7 %
Monocytes Absolute: 0.4 10*3/uL (ref 0.1–1.0)
Neutro Abs: 4.4 10*3/uL (ref 1.7–7.7)
Neutrophils Relative %: 72 %
PLATELETS: 224 10*3/uL (ref 150–400)
RBC: 4.08 MIL/uL (ref 3.87–5.11)
RDW: 13.7 % (ref 11.5–15.5)
WBC: 6 10*3/uL (ref 4.0–10.5)

## 2017-04-10 LAB — RAPID URINE DRUG SCREEN, HOSP PERFORMED
AMPHETAMINES: NOT DETECTED
Barbiturates: NOT DETECTED
Benzodiazepines: POSITIVE — AB
Cocaine: NOT DETECTED
OPIATES: POSITIVE — AB
Tetrahydrocannabinol: NOT DETECTED

## 2017-04-10 LAB — COMPREHENSIVE METABOLIC PANEL
ALBUMIN: 3.4 g/dL — AB (ref 3.5–5.0)
ALT: 13 U/L — AB (ref 14–54)
AST: 23 U/L (ref 15–41)
Alkaline Phosphatase: 102 U/L (ref 38–126)
Anion gap: 11 (ref 5–15)
BUN: 45 mg/dL — AB (ref 6–20)
CHLORIDE: 100 mmol/L — AB (ref 101–111)
CO2: 29 mmol/L (ref 22–32)
CREATININE: 1.95 mg/dL — AB (ref 0.44–1.00)
Calcium: 9.5 mg/dL (ref 8.9–10.3)
GFR calc Af Amer: 25 mL/min — ABNORMAL LOW (ref >60)
GFR calc non Af Amer: 22 mL/min — ABNORMAL LOW (ref >60)
GLUCOSE: 108 mg/dL — AB (ref 65–99)
POTASSIUM: 3.8 mmol/L (ref 3.5–5.1)
SODIUM: 140 mmol/L (ref 135–145)
Total Bilirubin: 1 mg/dL (ref 0.3–1.2)
Total Protein: 6.3 g/dL — ABNORMAL LOW (ref 6.5–8.1)

## 2017-04-10 LAB — CBC
HCT: 34 % — ABNORMAL LOW (ref 36.0–46.0)
Hemoglobin: 10.7 g/dL — ABNORMAL LOW (ref 12.0–15.0)
MCH: 28.9 pg (ref 26.0–34.0)
MCHC: 31.5 g/dL (ref 30.0–36.0)
MCV: 91.9 fL (ref 78.0–100.0)
PLATELETS: 213 10*3/uL (ref 150–400)
RBC: 3.7 MIL/uL — AB (ref 3.87–5.11)
RDW: 13.6 % (ref 11.5–15.5)
WBC: 5.4 10*3/uL (ref 4.0–10.5)

## 2017-04-10 LAB — I-STAT TROPONIN, ED: Troponin i, poc: 0.02 ng/mL (ref 0.00–0.08)

## 2017-04-10 LAB — CBG MONITORING, ED: Glucose-Capillary: 110 mg/dL — ABNORMAL HIGH (ref 65–99)

## 2017-04-10 LAB — URINALYSIS, MICROSCOPIC (REFLEX)

## 2017-04-10 LAB — I-STAT CG4 LACTIC ACID, ED
Lactic Acid, Venous: 0.64 mmol/L (ref 0.5–1.9)
Lactic Acid, Venous: 0.58 mmol/L (ref 0.5–1.9)

## 2017-04-10 LAB — ETHANOL

## 2017-04-10 MED ORDER — ENOXAPARIN SODIUM 30 MG/0.3ML ~~LOC~~ SOLN
30.0000 mg | SUBCUTANEOUS | Status: DC
  Administered 2017-04-10: 30 mg via SUBCUTANEOUS
  Filled 2017-04-10: qty 0.3

## 2017-04-10 MED ORDER — DEXTROSE 5 % IV SOLN: 1.0000 g | INTRAVENOUS | Status: DC

## 2017-04-10 MED ORDER — MORPHINE SULFATE 15 MG PO TABS
15.0000 mg | ORAL_TABLET | Freq: Two times a day (BID) | ORAL | Status: DC
  Administered 2017-04-10 – 2017-04-11 (×2): 15 mg via ORAL
  Filled 2017-04-10 (×2): qty 1

## 2017-04-10 MED ORDER — PROSIGHT PO TABS
1.0000 | ORAL_TABLET | Freq: Every day | ORAL | Status: DC
  Administered 2017-04-11: 1 via ORAL
  Filled 2017-04-10: qty 1

## 2017-04-10 MED ORDER — ONDANSETRON HCL 4 MG/2ML IJ SOLN: 4.0000 mg | Freq: Four times a day (QID) | INTRAMUSCULAR | Status: DC | PRN

## 2017-04-10 MED ORDER — ASPIRIN 81 MG PO CHEW
81.0000 mg | CHEWABLE_TABLET | Freq: Every day | ORAL | Status: DC
  Administered 2017-04-10 – 2017-04-11 (×2): 81 mg via ORAL
  Filled 2017-04-10 (×2): qty 1

## 2017-04-10 MED ORDER — DEXTROSE 5 % IV SOLN
1.0000 g | Freq: Once | INTRAVENOUS | Status: AC
  Administered 2017-04-10: 1 g via INTRAVENOUS
  Filled 2017-04-10: qty 10

## 2017-04-10 MED ORDER — ONDANSETRON HCL 4 MG PO TABS: 4.0000 mg | ORAL_TABLET | Freq: Four times a day (QID) | ORAL | Status: DC | PRN

## 2017-04-10 MED ORDER — FLUOXETINE HCL 20 MG PO CAPS
40.0000 mg | ORAL_CAPSULE | Freq: Every day | ORAL | Status: DC
  Administered 2017-04-11: 40 mg via ORAL
  Filled 2017-04-10 (×2): qty 2

## 2017-04-10 MED ORDER — ACETAMINOPHEN 650 MG RE SUPP: 650.0000 mg | Freq: Four times a day (QID) | RECTAL | Status: DC | PRN

## 2017-04-10 MED ORDER — ENSURE ENLIVE PO LIQD
237.0000 mL | Freq: Two times a day (BID) | ORAL | Status: DC
  Administered 2017-04-11: 237 mL via ORAL

## 2017-04-10 MED ORDER — INFLUENZA VAC SPLIT HIGH-DOSE 0.5 ML IM SUSY
0.5000 mL | PREFILLED_SYRINGE | INTRAMUSCULAR | Status: DC
  Filled 2017-04-10: qty 0.5

## 2017-04-10 MED ORDER — ACETAMINOPHEN 325 MG PO TABS: 650.0000 mg | ORAL_TABLET | Freq: Four times a day (QID) | ORAL | Status: DC | PRN

## 2017-04-10 NOTE — H&P (Signed)
History and Physical        Hospital Admission Note Date: 04/10/2017  Patient name: Julie Mclean Medical record number: [id number] Date of birth: [birth year] Age: 83 y.o. Gender: female  PCP: Catha Gosselin, MD    Patient coming from: Home  I have reviewed all records in the [facility].    Chief Complaint:  Confused and hallucinating this morning  HPI: Patient is a 83 year old female with history of diastolic CHF, EF 40%, chronic back pain on narcotics, CKD stage III, aortic stenosis presented to ED with her daughter, husband for evaluation of acute mental status changes this morning.  History was obtained from the patient's daughter who reported that patient was in her normal baseline health until this morning.  The patient had called her daughter at her work and asked her to remove the "evil people from her apartment".  Patient apparently had fallen (states tripped) on the rug in the living room and had hit her forehead this morning.  The fall was unwitnessed.  Patient's daughter reported that patient has some cognitive issues and memory changes in the last few months.  The daughter also reported that patient had fallen at least 3 times over the past week.  In the ED, patient is much more alert and oriented and close to her baseline. Patient daughter also noted that patient had taken her temazepam this morning which she was supposed to take only at bedtime.  She is also on MSIR bid which was not altered in her pillbox.  Patient daughter reported that patient goes easily into withdrawals if her dose is missed.  Patient's daughter also reported right-sided slight facial drooping otherwise no focal weakness.  ED work-up/course:  Temp 98.7, pulse 73, respiratory rate 13, BP 144/59, O2 sats 97% on room air Labs unremarkable except BUN 45, creatinine 1.95.  Creatinine in February  2017 was 2.18 Lactic acid 0.5, troponin negative CBC unremarkable hemoglobin 11.6, in February was 9.0  Review of Systems: Positives marked in 'bold' Constitutional: Denies fever, chills, diaphoresis, poor appetite and fatigue.  HEENT: Denies photophobia, eye pain, redness, hearing loss, ear pain, congestion, sore throat, rhinorrhea, sneezing, mouth sores, trouble swallowing, neck pain, neck stiffness and tinnitus.   Respiratory: Denies SOB, DOE, cough, chest tightness,  and wheezing.   Cardiovascular: Denies chest pain, palpitations and leg swelling.  Gastrointestinal: Denies nausea, vomiting, abdominal pain, diarrhea, constipation, blood in stool and abdominal distention.  Genitourinary: Denies dysuria, urgency, frequency, hematuria, flank pain and difficulty urinating.  Musculoskeletal: Denies myalgias, back pain, joint swelling, arthralgias and gait problem.  Skin: Denies pallor, rash and wound.  Neurological: Please see HPI  Hematological: Denies adenopathy. Easy bruising, personal or family bleeding history  Psychiatric/Behavioral: Please see HPI regarding confusion this morning  Past Medical History: Past Medical History:  Diagnosis Date  . Bronchitis   . CHF (congestive heart failure) (HCC)   . Chronic back pain   . Depression   . Headache(784.0)   . Heart valve disease   . Hypertension   . Hypothyroidism   . Renal disorder     Past Surgical History:  Procedure Laterality Date  . ABDOMINAL HYSTERECTOMY    .  HERNIA REPAIR    . TONSILLECTOMY      Medications: Prior to Admission medications   Medication Sig Start Date End Date Taking? Authorizing Provider  aspirin 81 MG chewable tablet Chew 81 mg by mouth daily.   Yes [provider]  FLUoxetine (PROZAC) 40 MG capsule Take 40 mg by mouth daily.   Yes [provider]  furosemide (LASIX) 40 MG tablet Take 1 tablet (40 mg total) by mouth daily. 06/08/11  Yes Hongalgi, Maximino Greenland, MD  losartan (COZAAR) 25 MG  tablet Take 25 mg by mouth daily.   Yes [provider]  morphine (MSIR) 15 MG tablet Take 1 tablet (15 mg total) by mouth every 8 (eight) hours as needed for severe pain. Patient taking differently: Take 15 mg by mouth 2 (two) times daily.  06/25/15  Yes Hollice Espy, MD  Multiple Vitamins-Minerals [facility] ADULT 50+) CAPS Take 1 tablet by mouth daily.   Yes [provider]  temazepam (RESTORIL) 15 MG capsule Take 15 mg by mouth at bedtime.    Yes [provider]    Allergies:   Allergies  Allergen Reactions  . Other Other (See Comments)    Reaction to FA dye  . Sulfamethoxazole Other (See Comments)    unknown  . Ciprofloxacin Rash  . Sulfa Antibiotics Rash    Social History:  reports that  has never smoked. she has never used smokeless tobacco. She reports that she does not drink alcohol or use drugs.  Family History: Family History  Problem Relation Age of Onset  . Stroke Mother   . Lung cancer Father     Physical Exam: Blood pressure 139/67, pulse 73, temperature 98.7 F (37.1 C), temperature source Oral, resp. rate 13, SpO2 97 %. General: Alert, awake, oriented x3 (knows that she is in [facility] hospital, oriented to time, person) in no acute distress. Eyes: pink conjunctiva,anicteric sclera, pupils equal and reactive to light and accomodation, HEENT: normocephalic, atraumatic, oropharynx clear Neck: supple, no masses or lymphadenopathy, no goiter, no bruits, no JVD CVS: Regular rate and rhythm, without murmurs, rubs or gallops. No lower extremity edema Resp : Clear to auscultation bilaterally, no wheezing, rales or rhonchi. GI : Soft, nontender, nondistended, positive bowel sounds, no masses. No hepatomegaly. No hernia.  Musculoskeletal: No clubbing or cyanosis, positive pedal pulses. No contracture. ROM intact  Neuro: Grossly intact, no focal neurological deficits, strength 5/5 upper and lower extremities bilaterally Psych: alert and oriented  x 3 but slightly confused on prolonged conversation Skin: no rashes or lesions, warm and dry   LABS on Admission: I have personally reviewed all the labs and imagings below    Basic Metabolic Panel: Recent Labs  Lab 04/10/17 1528  NA 140  K 3.8  CL 100*  CO2 29  GLUCOSE 108*  BUN 45*  CREATININE 1.95*  CALCIUM 9.5   Liver Function Tests: Recent Labs  Lab 04/10/17 1528  AST 23  ALT 13*  ALKPHOS 102  BILITOT 1.0  PROT 6.3*  ALBUMIN 3.4*   No results for input(s): LIPASE, AMYLASE in the last 168 hours. No results for input(s): AMMONIA in the last 168 hours. CBC: Recent Labs  Lab 04/10/17 1528  WBC 6.0  NEUTROABS 4.4  HGB 11.6*  HCT 37.8  MCV 92.6  PLT 224   Cardiac Enzymes: No results for input(s): CKTOTAL, CKMB, CKMBINDEX, TROPONINI in the last 168 hours. BNP: Invalid input(s): POCBNP CBG: Recent Labs  Lab 04/10/17 1540  GLUCAP 110*  Radiological Exams on Admission:  Dg Chest 2 View  Result Date: 04/10/2017 CLINICAL DATA:  Altered mental status after fall. EXAM: CHEST  2 VIEW COMPARISON:  Radiographs of May 28, 2015. FINDINGS: Stable cardiomediastinal silhouette. Atherosclerosis of thoracic aorta is noted. No pneumothorax or pleural effusion is noted. Bony thorax is unremarkable. No acute pulmonary disease is noted. IMPRESSION: No active cardiopulmonary disease.  Aortic atherosclerosis. Electronically Signed   By: Lupita RaiderJames  Green Jr, M.D.   On: 04/10/2017 15:25   Dg Elbow Complete Right  Result Date: 04/10/2017 CLINICAL DATA:  Fall.  Bruising along posterior elbow. EXAM: RIGHT ELBOW - COMPLETE 3+ VIEW COMPARISON:  None. FINDINGS: No acute bony abnormality. Specifically, no fracture, subluxation, or dislocation. Soft tissues are intact. No joint effusion. IMPRESSION: No acute bony abnormality. Electronically Signed   By: Charlett NoseKevin  Dover M.D.   On: 04/10/2017 15:22   Ct Head Wo Contrast  Result Date: 04/10/2017 CLINICAL DATA:  83 year old post fall  today. Altered level of consciousness. Initial encounter. EXAM: CT HEAD WITHOUT CONTRAST CT CERVICAL SPINE WITHOUT CONTRAST TECHNIQUE: Multidetector CT imaging of the head and cervical spine was performed following the standard protocol without intravenous contrast. Multiplanar CT image reconstructions of the cervical spine were also generated. COMPARISON:  None. FINDINGS: CT HEAD FINDINGS Brain: No intracranial hemorrhage or CT evidence of large acute infarct. Chronic microvascular changes. Mild age related atrophy. No intracranial mass lesion noted on this unenhanced exam. Vascular: Vascular calcifications. Skull: No skull fracture.  Hyperostosis frontalis interna. Sinuses/Orbits: No acute orbital abnormality. Visualized paranasal sinuses are clear. Other: Mastoid air cells and middle ear cavities are clear. Mild temporomandibular joint degenerative changes. CT CERVICAL SPINE FINDINGS Alignment: Slight curvature cervical spine. Skull base and vertebrae: No cervical spine fracture. Soft tissues and spinal canal: No abnormal prevertebral soft tissue swelling. Disc levels: Multilevel cervical spondylotic changes with various degrees spinal stenosis and foraminal narrowing. Fusion C3-4 facet joint. Upper chest: Scarring with calcification Other: Goiter with calcified lesions measuring up to 2.5 cm. IMPRESSION: No skull fracture or intracranial hemorrhage. No cervical spine fracture or abnormal prevertebral soft tissue swelling. Chronic changes as detailed above. Electronically Signed   By: Lacy DuverneySteven  Olson M.D.   On: 04/10/2017 15:53   Ct Cervical Spine Wo Contrast  Result Date: 04/10/2017 CLINICAL DATA:  83 year old post fall today. Altered level of consciousness. Initial encounter. EXAM: CT HEAD WITHOUT CONTRAST CT CERVICAL SPINE WITHOUT CONTRAST TECHNIQUE: Multidetector CT imaging of the head and cervical spine was performed following the standard protocol without intravenous contrast. Multiplanar CT image  reconstructions of the cervical spine were also generated. COMPARISON:  None. FINDINGS: CT HEAD FINDINGS Brain: No intracranial hemorrhage or CT evidence of large acute infarct. Chronic microvascular changes. Mild age related atrophy. No intracranial mass lesion noted on this unenhanced exam. Vascular: Vascular calcifications. Skull: No skull fracture.  Hyperostosis frontalis interna. Sinuses/Orbits: No acute orbital abnormality. Visualized paranasal sinuses are clear. Other: Mastoid air cells and middle ear cavities are clear. Mild temporomandibular joint degenerative changes. CT CERVICAL SPINE FINDINGS Alignment: Slight curvature cervical spine. Skull base and vertebrae: No cervical spine fracture. Soft tissues and spinal canal: No abnormal prevertebral soft tissue swelling. Disc levels: Multilevel cervical spondylotic changes with various degrees spinal stenosis and foraminal narrowing. Fusion C3-4 facet joint. Upper chest: Scarring with calcification Other: Goiter with calcified lesions measuring up to 2.5 cm. IMPRESSION: No skull fracture or intracranial hemorrhage. No cervical spine fracture or abnormal prevertebral soft tissue swelling. Chronic changes as detailed above. Electronically Signed  By: Lacy DuverneySteven  Olson M.D.   On: 04/10/2017 15:53      EKG: Independently reviewed.  Rate 76, normal sinus rhythm, prolonged QTC   Assessment/Plan Principal Problem:   Acute metabolic encephalopathy -Multifactorial possibly from temazepam (inadvertently taken this morning instead of at bedtime) versus UTI, rule out TIA/CVA - will obtain MRI of the brain to rule out stroke, if negative no further workup -UA does not appear to be grossly positive for UTI except for small leukocytes, follow urine culture and sensitivities, continue IV Rocephin for now -Continue MSIR for now at outpatient dose   Active Problems:   Essential hypertension -BP currently stable, hold losartan and Lasix tonight -Restart Lasix  in a.m.    CKD (chronic kidney disease), stage IV (HCC) -Creatinine function appears to be at baseline, hold losartan and Lasix tonight  Acute lower UTI - Follow urine culture and sensitivities, continue IV Rocephin    Chronic diastolic congestive heart failure (HCC) -Currently appears to be compensated, hold Lasix tonight, may restart in a.m.  Chronic pain syndrome -Patient is on MSIR 15 mg twice daily, confirmed with daughter who reports that if patient misses any dose, she can easily goes into withdrawals. She also reported that patient did not take any extra MSIR pill  History of Goiter -Obtain TSH  Cognitive issues/dementia -Will obtain TSH, B12, folate, RPR, follow MRI of the brain  Falls -Does not appear to be syncopal episode from description, PT OT evaluation  DVT prophylaxis: Lovenox  CODE STATUS: Discussed with daughter, full CODE STATUS  Consults called: None  Family Communication: Admission, patients condition and plan of care including tests being ordered have been discussed with the patient's daughter and husband who indicates understanding and agree with the plan and Code Status  Admission status: Inpatient, telemetry  Disposition plan: Further plan will depend as patient's clinical course evolves and further radiologic and laboratory data become available.    At the time of admission, it appears that the appropriate admission status for this patient is INPATIENT . This is judged to be reasonable and necessary in order to provide the required intensity of service to ensure the patient's safety given the presenting symptoms confusion, falls, UTI, physical exam findings, and initial radiographic and laboratory data in the context of their chronic comorbidities.  The medical decision making on this patient was of high complexity and the patient is at high risk for clinical deterioration, therefore this is a level 3 visit.   Time Spent on Admission: 60  minutes    Jennife Zaucha M.D. [facility] 04/10/2017, 5:43 PM Pager: [phone]  If 7PM-7AM, please contact night-coverage [email or web address] Password [email or web address]

## 2017-04-10 NOTE — ED Notes (Signed)
Patient transported to X-ray & CT °

## 2017-04-10 NOTE — ED Provider Notes (Signed)
[facility] EMERGENCY DEPARTMENT Provider Note   CSN: [id number] Arrival date & time: 04/10/17  1330     History   Chief Complaint Chief Complaint  Patient presents with  . Fall  . Altered Mental Status    HPI Julie Mclean is a 83 y.o. female with a history of CHF, chronic back pain, heart valve disease with murmur, chronic back pain who presents today with her daughter for evaluation.  Her daughter reports that yesterday afternoon she was normal, however today the patient called her daughter and asked her to remove the evil people from her apartment.  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  Patient's daughter reports that she is normally fully with it, however still does not appear normal baseline.  Daughter reports that her symptoms seem to come and go.  Patient does not have a history of dementia or cognitive issues.  Patient has been well recently.  Daughter reports that she noticed patient has a bruise across her forehead, and a bruise on her right elbow.  Patient reports that she has fallen 3 times over the past week, however is a poor historian at this time.  Unsure if patient has fallen, if she has had syncopal events, or how she got these bruises.   Daughter reports concern that patient has been messing up her medicines and notes that she takes PO morphine daily and will withdrawal if she doesn't get it.    HPI  Past Medical History:  Diagnosis Date  . Bronchitis   . CHF (congestive heart failure) (HCC)   . Chronic back pain   . Depression   . Headache(784.0)   . Heart valve disease   . Hypertension   . Hypothyroidism   . Renal disorder     Patient Active Problem List   Diagnosis Date Noted  . Acute encephalopathy 04/10/2017  . Goiter 04/10/2017  . Chronic diastolic congestive heart failure (HCC) 06/25/2015  . Pelvic ring fracture (HCC) 06/25/2015  . Fracture of pubic ramus (HCC) 06/24/2015  .  Fall 06/24/2015  . Weight loss 12/31/2014  . Transaminitis 06/03/2011  . Hypotension 06/01/2011  . Diverticulitis 06/01/2011  . CKD (chronic kidney disease), stage IV (HCC) 06/01/2011  . UTI (lower urinary tract infection) 06/01/2011  . BRONCHITIS 09/13/2007  . MACULAR DEGENERATION 09/08/2007  . DEAFNESS 09/08/2007  . Essential hypertension 09/08/2007  . Aortic valve disorder 09/08/2007  . ALLERGIC RHINITIS 09/08/2007  . HEADACHE, CHRONIC 09/08/2007  . History of cardiovascular disorder 09/08/2007  . DYSPNEA 08/14/2007    Past Surgical History:  Procedure Laterality Date  . ABDOMINAL HYSTERECTOMY    . HERNIA REPAIR    . TONSILLECTOMY      OB History    No data available       Home Medications    Prior to Admission medications   Medication Sig Start Date End Date Taking? Authorizing Provider  aspirin 81 MG chewable tablet Chew 81 mg by mouth daily.   Yes [provider]  FLUoxetine (PROZAC) 40 MG capsule Take 40 mg by mouth daily.   Yes [provider]  furosemide (LASIX) 40 MG tablet Take 1 tablet (40 mg total) by mouth daily. 06/08/11  Yes Hongalgi, Maximino Greenland, MD  losartan (COZAAR) 25 MG tablet Take 25 mg by mouth daily.   Yes [provider]  morphine (MSIR) 15 MG tablet Take 1 tablet (15 mg total) by mouth every 8 (eight)  hours as needed for severe pain. Patient taking differently: Take 15 mg by mouth 2 (two) times daily.  06/25/15  Yes Hollice EspyKrishnan, Sendil K, MD  Multiple Vitamins-Minerals [facility](OCUVITE ADULT 50+) CAPS Take 1 tablet by mouth daily.   Yes [provider]  temazepam (RESTORIL) 15 MG capsule Take 15 mg by mouth at bedtime.    Yes [provider]    Family History Family History  Problem Relation Age of Onset  . Stroke Mother   . Lung cancer Father     Social History Social History   Tobacco Use  . Smoking status: Never Smoker  . Smokeless tobacco: Never Used  Substance Use Topics  . Alcohol use: No     Alcohol/week: 0.0 oz  . Drug use: No     Allergies   Other; Sulfamethoxazole; Ciprofloxacin; and Sulfa antibiotics   Review of Systems Review of Systems  Unable to perform ROS: Mental status change     Physical Exam Updated Vital Signs BP (!) 144/59   Pulse 79   Temp 98.7 F (37.1 C) (Oral)   Resp 16   SpO2 93%   Physical Exam  Constitutional: She appears well-developed and well-nourished. No distress.  HENT:  Head: Normocephalic and atraumatic. Head is without raccoon's eyes and without Battle's sign.  Right Ear: Tympanic membrane, external ear and ear canal normal. No hemotympanum.  Left Ear: Tympanic membrane, external ear and ear canal normal. No hemotympanum.  Nose: Nose normal.  Mouth/Throat: Uvula is midline and oropharynx is clear and moist.  Patient is very hard of hearing, baseline  Eyes: Conjunctivae and EOM are normal. Pupils are equal, round, and reactive to light.  Neck: Neck supple. No spinous process tenderness and no muscular tenderness present.  Cardiovascular: Normal rate and regular rhythm.  Murmur heard. Pulmonary/Chest: Effort normal and breath sounds normal. No respiratory distress.  Abdominal: Soft. Normal appearance and bowel sounds are normal. There is no tenderness. There is no rigidity, no rebound, no guarding and no CVA tenderness.  Musculoskeletal: She exhibits no edema.  Patient has 5/5 grip strength bilaterally.   Neurological: She is alert. She has normal strength. GCS eye subscore is 4. GCS verbal subscore is 5. GCS motor subscore is 6.  No facial droop, equal grip strengths bilaterally, is able to lift both legs off bed, alert and oriented to person, place and time.    Skin: Skin is warm and dry.  Psychiatric: She has a normal mood and affect. Her speech is normal and behavior is normal. She is actively hallucinating.  Hx of hallucinations this AM, not actively hallucinating now.   Nursing note and vitals reviewed.    ED Treatments  / Results  Labs (all labs ordered are listed, but only abnormal results are displayed) Labs Reviewed  URINALYSIS, ROUTINE W REFLEX MICROSCOPIC - Abnormal; Notable for the following components:      Result Value   APPearance HAZY (*)    Leukocytes, UA SMALL (*)    All other components within normal limits  COMPREHENSIVE METABOLIC PANEL - Abnormal; Notable for the following components:   Chloride 100 (*)    Glucose, Bld 108 (*)    BUN 45 (*)    Creatinine, Ser 1.95 (*)    Total Protein 6.3 (*)    Albumin 3.4 (*)    ALT 13 (*)    GFR calc non Af Amer 22 (*)    GFR calc Af Amer 25 (*)    All other components within  normal limits  CBC WITH DIFFERENTIAL/PLATELET - Abnormal; Notable for the following components:   Hemoglobin 11.6 (*)    All other components within normal limits  URINALYSIS, MICROSCOPIC (REFLEX) - Abnormal; Notable for the following components:   Bacteria, UA FEW (*)    Squamous Epithelial / LPF 0-5 (*)    All other components within normal limits  CBG MONITORING, ED - Abnormal; Notable for the following components:   Glucose-Capillary 110 (*)    All other components within normal limits  URINE CULTURE  ETHANOL  RAPID URINE DRUG SCREEN, HOSP PERFORMED  I-STAT TROPONIN, ED  I-STAT CG4 LACTIC ACID, ED  I-STAT CG4 LACTIC ACID, ED    EKG  EKG Interpretation  Date/Time:  Monday April 10 2017 15:24:51 EST Ventricular Rate:  71 PR Interval:    QRS Duration: 86 QT Interval:  438 QTC Calculation: 476 R Axis:   33 Text Interpretation:  Sinus rhythm Prolonged PR interval Abnormal R-wave progression, early transition Probable LVH with secondary repol abnrm since last tracing no significant change Confirmed by Rolan Bucco [phone]) on 04/10/2017 3:43:55 PM       Radiology Dg Chest 2 View  Result Date: 04/10/2017 CLINICAL DATA:  Altered mental status after fall. EXAM: CHEST  2 VIEW COMPARISON:  Radiographs of May 28, 2015. FINDINGS: Stable cardiomediastinal  silhouette. Atherosclerosis of thoracic aorta is noted. No pneumothorax or pleural effusion is noted. Bony thorax is unremarkable. No acute pulmonary disease is noted. IMPRESSION: No active cardiopulmonary disease.  Aortic atherosclerosis. Electronically Signed   By: Lupita Raider, M.D.   On: 04/10/2017 15:25   Dg Elbow Complete Right  Result Date: 04/10/2017 CLINICAL DATA:  Fall.  Bruising along posterior elbow. EXAM: RIGHT ELBOW - COMPLETE 3+ VIEW COMPARISON:  None. FINDINGS: No acute bony abnormality. Specifically, no fracture, subluxation, or dislocation. Soft tissues are intact. No joint effusion. IMPRESSION: No acute bony abnormality. Electronically Signed   By: Charlett Nose M.D.   On: 04/10/2017 15:22   Ct Head Wo Contrast  Result Date: 04/10/2017 CLINICAL DATA:  83 year old post fall today. Altered level of consciousness. Initial encounter. EXAM: CT HEAD WITHOUT CONTRAST CT CERVICAL SPINE WITHOUT CONTRAST TECHNIQUE: Multidetector CT imaging of the head and cervical spine was performed following the standard protocol without intravenous contrast. Multiplanar CT image reconstructions of the cervical spine were also generated. COMPARISON:  None. FINDINGS: CT HEAD FINDINGS Brain: No intracranial hemorrhage or CT evidence of large acute infarct. Chronic microvascular changes. Mild age related atrophy. No intracranial mass lesion noted on this unenhanced exam. Vascular: Vascular calcifications. Skull: No skull fracture.  Hyperostosis frontalis interna. Sinuses/Orbits: No acute orbital abnormality. Visualized paranasal sinuses are clear. Other: Mastoid air cells and middle ear cavities are clear. Mild temporomandibular joint degenerative changes. CT CERVICAL SPINE FINDINGS Alignment: Slight curvature cervical spine. Skull base and vertebrae: No cervical spine fracture. Soft tissues and spinal canal: No abnormal prevertebral soft tissue swelling. Disc levels: Multilevel cervical spondylotic changes with  various degrees spinal stenosis and foraminal narrowing. Fusion C3-4 facet joint. Upper chest: Scarring with calcification Other: Goiter with calcified lesions measuring up to 2.5 cm. IMPRESSION: No skull fracture or intracranial hemorrhage. No cervical spine fracture or abnormal prevertebral soft tissue swelling. Chronic changes as detailed above. Electronically Signed   By: Lacy Duverney M.D.   On: 04/10/2017 15:53   Ct Cervical Spine Wo Contrast  Result Date: 04/10/2017 CLINICAL DATA:  83 year old post fall today. Altered level of consciousness. Initial encounter. EXAM: CT HEAD WITHOUT  CONTRAST CT CERVICAL SPINE WITHOUT CONTRAST TECHNIQUE: Multidetector CT imaging of the head and cervical spine was performed following the standard protocol without intravenous contrast. Multiplanar CT image reconstructions of the cervical spine were also generated. COMPARISON:  None. FINDINGS: CT HEAD FINDINGS Brain: No intracranial hemorrhage or CT evidence of large acute infarct. Chronic microvascular changes. Mild age related atrophy. No intracranial mass lesion noted on this unenhanced exam. Vascular: Vascular calcifications. Skull: No skull fracture.  Hyperostosis frontalis interna. Sinuses/Orbits: No acute orbital abnormality. Visualized paranasal sinuses are clear. Other: Mastoid air cells and middle ear cavities are clear. Mild temporomandibular joint degenerative changes. CT CERVICAL SPINE FINDINGS Alignment: Slight curvature cervical spine. Skull base and vertebrae: No cervical spine fracture. Soft tissues and spinal canal: No abnormal prevertebral soft tissue swelling. Disc levels: Multilevel cervical spondylotic changes with various degrees spinal stenosis and foraminal narrowing. Fusion C3-4 facet joint. Upper chest: Scarring with calcification Other: Goiter with calcified lesions measuring up to 2.5 cm. IMPRESSION: No skull fracture or intracranial hemorrhage. No cervical spine fracture or abnormal prevertebral  soft tissue swelling. Chronic changes as detailed above. Electronically Signed   By: Lacy DuverneySteven  Olson M.D.   On: 04/10/2017 15:53    Procedures Procedures (including critical care time)  Medications Ordered in ED Medications  cefTRIAXone (ROCEPHIN) 1 g in dextrose 5 % 50 mL IVPB (not administered)     Initial Impression / Assessment and Plan / ED Course  I have reviewed the triage vital signs and the nursing notes.  Pertinent labs & imaging results that were available during my care of the patient were reviewed by me and considered in my medical decision making (see chart for details).    Zadie CleverlyMary B Mclean presents today for evaluation of hallucinations/altered mental status.  She was last seen normal last night.  She does not have any history of dementia and this confusion/hallucinations appears to be waxing and waning.  Labs appear consistent with normal baseline.  CT head and neck were obtained without any acute abnormalities.  She does have a Goiter with calcified lesions measuring up to 2.5 cm. Urine with questionable UTI. Given encephalopathy will treat with rocephin.  I feel that patient needs admission for continued observation and possible MRI given new onset hallucinations and confusion.  Patient does not meet sirs or sepsis criteria at this time.   This patient was seen as a shared visit with Dr. Fredderick PhenixBelfi who evaluated the patient and agreed with my plan.    The patient appears reasonably stabilized for admission considering the current resources, flow, and capabilities available in the ED at this time, and I doubt any [facility] further screening and/or treatment in the ED prior to admission.   Final Clinical Impressions(s) / ED Diagnoses   Final diagnoses:  Goiter  Encephalopathy    ED Discharge Orders    None       Cristina GongHammond, Chantella Creech W, PA-C 04/10/17 1707    Rolan BuccoBelfi, Melanie, MD 04/15/17 1348

## 2017-04-10 NOTE — ED Triage Notes (Signed)
Pt presents for evaluation of fall today with bruising to middle of forehead. Family reports altered mental status since event. No blood thinners. Pt is AxO x4 at baseline, hard of hearing.

## 2017-04-11 ENCOUNTER — Inpatient Hospital Stay (HOSPITAL_COMMUNITY): Payer: Medicare Other

## 2017-04-11 DIAGNOSIS — N184 Chronic kidney disease, stage 4 (severe): Secondary | ICD-10-CM | POA: Diagnosis not present

## 2017-04-11 DIAGNOSIS — I5032 Chronic diastolic (congestive) heart failure: Secondary | ICD-10-CM | POA: Diagnosis not present

## 2017-04-11 DIAGNOSIS — G9340 Encephalopathy, unspecified: Secondary | ICD-10-CM | POA: Diagnosis not present

## 2017-04-11 LAB — URINE CULTURE: Culture: NO GROWTH

## 2017-04-11 LAB — BASIC METABOLIC PANEL
ANION GAP: 9 (ref 5–15)
BUN: 41 mg/dL — ABNORMAL HIGH (ref 6–20)
CALCIUM: 9.8 mg/dL (ref 8.9–10.3)
CO2: 30 mmol/L (ref 22–32)
Chloride: 103 mmol/L (ref 101–111)
Creatinine, Ser: 1.8 mg/dL — ABNORMAL HIGH (ref 0.44–1.00)
GFR, EST AFRICAN AMERICAN: 28 mL/min — AB (ref >60)
GFR, EST NON AFRICAN AMERICAN: 24 mL/min — AB (ref >60)
Glucose, Bld: 114 mg/dL — ABNORMAL HIGH (ref 65–99)
Potassium: 4.2 mmol/L (ref 3.5–5.1)
SODIUM: 142 mmol/L (ref 135–145)

## 2017-04-11 LAB — RPR: RPR Ser Ql: NONREACTIVE

## 2017-04-11 LAB — CBC
HCT: 35.8 % — ABNORMAL LOW (ref 36.0–46.0)
HEMOGLOBIN: 11 g/dL — AB (ref 12.0–15.0)
MCH: 28.1 pg (ref 26.0–34.0)
MCHC: 30.7 g/dL (ref 30.0–36.0)
MCV: 91.3 fL (ref 78.0–100.0)
Platelets: 235 10*3/uL (ref 150–400)
RBC: 3.92 MIL/uL (ref 3.87–5.11)
RDW: 13.3 % (ref 11.5–15.5)
WBC: 6.8 10*3/uL (ref 4.0–10.5)

## 2017-04-11 LAB — TSH: TSH: 0.851 u[IU]/mL (ref 0.350–4.500)

## 2017-04-11 LAB — VITAMIN B12: VITAMIN B 12: 1001 pg/mL — AB (ref 180–914)

## 2017-04-11 LAB — FOLATE: FOLATE: 58.5 ng/mL (ref >5.9)

## 2017-04-11 LAB — HIV ANTIBODY (ROUTINE TESTING W REFLEX): HIV Screen 4th Generation wRfx: NONREACTIVE

## 2017-04-11 MED ORDER — CEFUROXIME AXETIL 250 MG PO TABS: 250.0000 mg | ORAL_TABLET | Freq: Two times a day (BID) | ORAL | 0 refills | Status: DC

## 2017-04-11 MED ORDER — CEFUROXIME AXETIL 500 MG PO TABS
250.0000 mg | ORAL_TABLET | Freq: Two times a day (BID) | ORAL | Status: DC
  Administered 2017-04-11: 250 mg via ORAL
  Filled 2017-04-11: qty 1

## 2017-04-11 MED ORDER — CEFUROXIME AXETIL 500 MG PO TABS: 250.0000 mg | ORAL_TABLET | Freq: Every day | ORAL | Status: DC

## 2017-04-11 MED ORDER — MORPHINE SULFATE 15 MG PO TABS: 15.0000 mg | ORAL_TABLET | Freq: Two times a day (BID) | ORAL | Status: AC

## 2017-04-11 NOTE — Progress Notes (Signed)
Discharge instructions given. Pt verbalized understanding and all questions were answered.  

## 2017-04-11 NOTE — Care Management Obs Status (Signed)
MEDICARE OBSERVATION STATUS NOTIFICATION   Patient Details  Name: Julie Mclean MRN: [id number] Date of Birth: 10/08/1927   Medicare Observation Status Notification Given:  Yes    Lawerance Sabalebbie Jarel Cuadra, RN 04/11/2017, 9:14 AM

## 2017-04-11 NOTE — Plan of Care (Signed)
  Acute Rehab PT Goals(only PT should resolve) Pt Will Go Supine/Side To Sit 04/11/2017 1218 by Roxy MannsKress, Avyukth Bontempo Marie, PT Flowsheets Taken 04/11/2017 1218  Pt will go Supine/Side to Sit Independently Patient Will Transfer Sit To/From Stand 04/11/2017 1218 by Roxy MannsKress, Stormie Ventola Marie, PT Flowsheets Taken 04/11/2017 1218  Patient will transfer sit to/from stand with modified independence Pt Will Ambulate 04/11/2017 1218 by Roxy MannsKress, Arhaan Chesnut Marie, PT Flowsheets Taken 04/11/2017 1218  Pt will Ambulate > 125 feet;with modified independence;with cane

## 2017-04-11 NOTE — Care Management Note (Signed)
Case Management Note  Patient Details  Name: Zadie CleverlyMary B Scrogham MRN: [id number] Date of Birth: 03/18/1928  Subjective/Objective:    Acute Encephalopathy              Action/Plan: Patient lives at home with her spouse;PCP: Catha GosselinLittle, Kevin, MD; has private insurance with [facility] with prescription drug coverage; pharmacy of choice is [facility]; DME - walker and cane at home; [facility], [facility]; [place] with [facility] called for arrangements.  Expected Discharge Date:  04/11/17               Expected Discharge Plan:  Home w Home Health Services  Discharge planning Services  CM Consult Choice offered to:  Patient, Adult Children HH Arranged:  RN, PT HH [facility]  Status of Service:  In process, will continue to follow  Reola MosherChandler, Mizani Dilday L, RN,MHA,BSN [phone] 04/11/2017, 10:04 AM

## 2017-04-11 NOTE — Evaluation (Signed)
Physical Therapy Evaluation Patient Details Name: Julie Mclean MRN: [id number] DOB: 1927-10-19 Today's Date: 04/11/2017   History of Present Illness  Pt is a 83 yo female admitted under observation through ED on 04/10/17 following increased confusion and fall. Pt was diagnosed with acute encephalopathy which was possibly determined to be from not taking medication properly. PMH significiant for bronchitis, CHF, H/A, Depression, HTN, hypothyroid, CKD, UTI, goiter.   Clinical Impression   Pt presents with the above diagnosis and below deficits for therapy evaluation. Prior to admission, pt lived with her husband in a single level home with family checking in every other day. Pt performs gait throughout home and outside home with a RW or [place] and has had multiple falls over the past 6 months. Pt is able to perform transfers, bed mobility and gait with supervision to Mod I with no LOB noted. Pt performed short distance gait with RW and without and AD. Pt will benefit form HHPT at discharge and will benefit from continued acute PT services as needed while admitted.     Follow Up Recommendations Home health PT    Equipment Recommendations  None recommended by PT    Recommendations for Other Services       Precautions / Restrictions Precautions Precautions: Fall Restrictions Weight Bearing Restrictions: No      Mobility  Bed Mobility Overal bed mobility: Modified Independent             General bed mobility comments: able to get into and out of bed without hands on assistance  Transfers Overall transfer level: Needs assistance Equipment used: None Transfers: Sit to/from Stand Sit to Stand: Supervision         General transfer comment: supervision for safety, bordering on MI  Ambulation/Gait Ambulation/Gait assistance: Supervision;Modified independent (Device/Increase time) Ambulation Distance (Feet): 150 Feet Assistive device: Rolling walker (2 wheeled);None Gait  Pattern/deviations: Step-through pattern;Decreased stride length;Trunk flexed Gait velocity: decreased Gait velocity interpretation: Below normal speed for age/gender General Gait Details: Minimal trunk flexion, Mod I with RW and MIn guard to supervision for safety without AD  Stairs            Wheelchair Mobility    Modified Rankin (Stroke Patients Only)       Balance Overall balance assessment: Needs assistance Sitting-balance support: No upper extremity supported;Feet supported Sitting balance-Leahy Scale: Normal     Standing balance support: No upper extremity supported;Bilateral upper extremity supported Standing balance-Leahy Scale: Fair                               Pertinent Vitals/Pain Pain Assessment: No/denies pain    Home Living Family/patient expects to be discharged to:: Private residence Living Arrangements: Spouse/significant other Available Help at Discharge: Family;Available 24 hours/day Type of Home: Apartment Home Access: Level entry     Home Layout: One level Home Equipment: Walker - 4 wheels;Toilet riser;Cane - single point      Prior Function Level of Independence: Needs assistance   Gait / Transfers Assistance Needed: walking with walker and cane throughout apartment and home  ADL's / Homemaking Assistance Needed: requires assistance with medication management but is able to perform dressing and bathing  Comments: husband assists as needed. Pt has been mostly independent prior to admission     Hand Dominance   Dominant Hand: Right    Extremity/Trunk Assessment   Upper Extremity Assessment Upper Extremity Assessment: Overall WFL for tasks assessed  Lower Extremity Assessment Lower Extremity Assessment: Overall WFL for tasks assessed    Cervical / Trunk Assessment Cervical / Trunk Assessment: Kyphotic  Communication   Communication: HOH  Cognition Arousal/Alertness: Awake/alert Behavior During Therapy: WFL  for tasks assessed/performed Overall Cognitive Status: Within Functional Limits for tasks assessed                                        General Comments      Exercises     Assessment/Plan    PT Assessment All further PT needs can be met in the next venue of care  PT Problem List Decreased activity tolerance;Decreased balance;Decreased mobility;Decreased knowledge of use of DME;Decreased safety awareness       PT Treatment Interventions      PT Goals (Current goals can be found in the Care Plan section)  Acute Rehab PT Goals Patient Stated Goal: to get home today PT Goal Formulation: With patient/family Time For Goal Achievement: 04/18/17 Potential to Achieve Goals: Good    Frequency     Barriers to discharge        Co-evaluation               AM-PAC PT "6 Clicks" Daily Activity  Outcome Measure Difficulty turning over in bed (including adjusting bedclothes, sheets and blankets)?: None Difficulty moving from lying on back to sitting on the side of the bed? : None Difficulty sitting down on and standing up from a chair with arms (e.g., wheelchair, bedside commode, etc,.)?: A Little Help needed moving to and from a bed to chair (including a wheelchair)?: A Little Help needed walking in hospital room?: A Little Help needed climbing 3-5 steps with a railing? : A Little 6 Click Score: 20    End of Session Equipment Utilized During Treatment: Gait belt Activity Tolerance: Patient tolerated treatment well Patient left: in bed;with call bell/phone within reach;with bed alarm set Nurse Communication: Mobility status PT Visit Diagnosis: History of falling (Z91.81);Other abnormalities of gait and mobility (R26.89)    Time: 3832-9191 PT Time Calculation (min) (ACUTE ONLY): 19 min   Charges:   PT Evaluation $PT Eval Moderate Complexity: 1 Mod     PT G Codes:   PT G-Codes **NOT FOR INPATIENT CLASS** Functional Assessment Tool Used: AM-PAC 6  Clicks Basic Mobility;Clinical judgement Functional Limitation: Mobility: Walking and moving around Mobility: Walking and Moving Around Current Status (Y6060): At least 20 percent but less than 40 percent impaired, limited or restricted Mobility: Walking and Moving Around Goal Status [phone]): At least 1 percent but less than 20 percent impaired, limited or restricted    Scheryl Marten PT, DPT     Jacqulyn Liner Sloan Leiter 04/11/2017, 12:15 PM

## 2017-04-11 NOTE — Care Management CC44 (Signed)
Condition Code 44 Documentation Completed  Patient Details  Name: Zadie CleverlyMary B Wesolowski MRN: [id number] Date of Birth: 10/15/1927   Condition Code 44 given:  Yes Patient signature on Condition Code 44 notice:  Yes Documentation of 2 MD's agreement:  Yes Code 44 added to claim:  Yes    Lawerance Sabalebbie Kamariya Blevens, RN 04/11/2017, 9:14 AM

## 2017-04-11 NOTE — Progress Notes (Signed)
Patient was in her bed awake. Patient hard of hearing, can only use her left ear. Patient's husband and daughters on-site and daughter busy on her private laptop. Patient and family very receptive and appreciative of chaplain's visit. Both patient and daughter spoke of their family church pastor's visit coming this afternoon. Chaplain provided reflective listening and compassionate presence.

## 2017-04-11 NOTE — Discharge Summary (Signed)
Physician Discharge Summary   Julie ID: Julie Mclean MRN: [id number] DOB/AGE: 81/10/1927 81 y.o.  Admit date: 04/10/2017 Discharge date: 04/11/2017  Primary Care Physician:  Catha GosselinLittle, Kevin, MD  Discharge Diagnoses:    . Acute encephalopathy resolved . Goiter . Chronic diastolic congestive heart failure (HCC) . CKD (chronic kidney disease), stage IV (HCC) . Essential hypertension . Lower urinary tract infectious disease  mechanical fall    Consults:  None   Recommendations for Outpatient Follow-up:  1. Home health PT, OT, RN, home health aide to be arranged by case management 2. Please repeat CBC/BMET at next visit 3. Please follow urine cultures till final   DIET: Heart healthy diet    Allergies:   Allergies  Allergen Reactions  . Other Other (See Comments)    Reaction to FA dye  . Sulfamethoxazole Other (See Comments)    unknown  . Ciprofloxacin Rash  . Sulfa Antibiotics Rash     DISCHARGE MEDICATIONS: Current Discharge Medication List    START taking these medications   Details  cefUROXime (CEFTIN) 250 MG tablet Take 1 tablet (250 mg total) by mouth 2 (two) times daily with a meal. X 3 days Qty: 6 tablet, Refills: 0      CONTINUE these medications which have CHANGED   Details  morphine (MSIR) 15 MG tablet Take 1 tablet (15 mg total) by mouth 2 (two) times daily.      CONTINUE these medications which have NOT CHANGED   Details  aspirin 81 MG chewable tablet Chew 81 mg by mouth daily.    FLUoxetine (PROZAC) 40 MG capsule Take 40 mg by mouth daily.    furosemide (LASIX) 40 MG tablet Take 1 tablet (40 mg total) by mouth daily.    losartan (COZAAR) 25 MG tablet Take 25 mg by mouth daily.    Multiple Vitamins-Minerals (OCUVITE ADULT 50+) CAPS Take 1 tablet by mouth daily.    temazepam (RESTORIL) 15 MG capsule Take 15 mg by mouth at bedtime.          Brief H and P: For complete details please refer to admission H and P, but in brief Julie is  a 83 year old female with history of diastolic CHF, EF 53%60%, chronic back pain on narcotics, CKD stage III, aortic stenosis presented to ED with her Mclean, Julie Mclean for evaluation of acute mental status changes this morning.  History was obtained from the Julie's Mclean who reported that Julie was in her normal baseline health until this morning.  The Julie had called her Mclean at her work and asked her to remove the "evil people from her apartment".  Julie apparently had fallen (states tripped) on the rug in the living room and had hit her forehead this morning.  The fall was unwitnessed.  Julie's Mclean reported that Julie has some cognitive issues and memory changes in the last few months.  The Mclean also reported that Julie had fallen at least 3 times over the past week.  In the ED, Julie is much more alert and oriented and close to her baseline. Julie Mclean also noted that Julie had taken her temazepam this morning which she was supposed to take only at bedtime.  She is also on MSIR bid which was not altered in her pillbox.  Julie Mclean reported that Julie goes easily into withdrawals if her dose is missed.  Julie's Mclean also reported right-sided slight facial drooping otherwise no focal weakness.    Hospital Course:     Acute  metabolic encephalopathy -Resolved, currently at baseline per Julie's Mclean at the bedside.  Per Mclean, however hallucinating symptoms had resolved at home after about an hour -Multifactorial possibly from temazepam (inadvertently taken in the morning instead of at bedtime dose), UTI.  MRI of the brain was done, no CVA -Julie was placed on IV Rocephin, transition to oral Ceftin for 3 days, follow urine cultures outpatient.  UA however was not grossly impressive for UTI. -PT evaluation -TSH, B12, folate normal    Essential hypertension -Currently stable resume losartan, Lasix at home    CKD (chronic kidney disease),  stage IV (HCC) -Creatinine function appears to be at her baseline, losartan and Lasix were held during admission    Lower urinary tract infectious disease -Julie was placed on IV Rocephin, transitioned to oral Ceftin for 3 days, follow urine cultures outpatient.  UA was not grossly impressive for UTI    Chronic diastolic congestive heart failure (HCC) -Compensated, continue Lasix at home  Chronic pain syndrome -Julie was continued on MSIR 15 mg twice daily confirmed from the Mclean that she takes scheduled dose instead of as needed and goes easily into withdrawals if misses the dose.  Julie and Mclean confirmed that she did not take any extra MSIR pill.  Goiter/history of -TSH 0.8   Day of Discharge BP (!) 164/43 (BP Location: Right Arm)   Pulse 68   Temp 98.1 F (36.7 C) (Oral)   Resp 18   Ht 5\' 4"  (1.626 m)   Wt 51 kg (112 lb 6.4 oz) Comment: scale b  SpO2 93%   BMI 19.29 kg/m   Physical Exam: General: Alert and awake oriented x3 not in any acute distress. HEENT: anicteric sclera, pupils reactive to light and accommodation CVS: S1-S2 clear no murmur rubs or gallops Chest: clear to auscultation bilaterally, no wheezing rales or rhonchi Abdomen: soft nontender, nondistended, normal bowel sounds Extremities: no cyanosis, clubbing or edema noted bilaterally Neuro: Cranial nerves II-XII intact, no focal neurological deficits   The results of significant diagnostics from this hospitalization (including imaging, microbiology, ancillary and laboratory) are listed below for reference.    LAB RESULTS: Basic Metabolic Panel: Recent Labs  Lab 04/10/17 1528 04/10/17 2046 04/11/17 0611  NA 140  --  142  K 3.8  --  4.2  CL 100*  --  103  CO2 29  --  30  GLUCOSE 108*  --  114*  BUN 45*  --  41*  CREATININE 1.95* 1.83* 1.80*  CALCIUM 9.5  --  9.8   Liver Function Tests: Recent Labs  Lab 04/10/17 1528  AST 23  ALT 13*  ALKPHOS 102  BILITOT 1.0  PROT 6.3*   ALBUMIN 3.4*   No results for input(s): LIPASE, AMYLASE in the last 168 hours. No results for input(s): AMMONIA in the last 168 hours. CBC: Recent Labs  Lab 04/10/17 1528 04/10/17 2046 04/11/17 0611  WBC 6.0 5.4 6.8  NEUTROABS 4.4  --   --   HGB 11.6* 10.7* 11.0*  HCT 37.8 34.0* 35.8*  MCV 92.6 91.9 91.3  PLT 224 213 235   Cardiac Enzymes: No results for input(s): CKTOTAL, CKMB, CKMBINDEX, TROPONINI in the last 168 hours. BNP: Invalid input(s): POCBNP CBG: Recent Labs  Lab 04/10/17 1540  GLUCAP 110*    Significant Diagnostic Studies:  Dg Chest 2 View  Result Date: 04/10/2017 CLINICAL DATA:  Altered mental status after fall. EXAM: CHEST  2 VIEW COMPARISON:  Radiographs of May 28, 2015. FINDINGS:  Stable cardiomediastinal silhouette. Atherosclerosis of thoracic aorta is noted. No pneumothorax or pleural effusion is noted. Bony thorax is unremarkable. No acute pulmonary disease is noted. IMPRESSION: No active cardiopulmonary disease.  Aortic atherosclerosis. Electronically Signed   By: Lupita Raider, M.D.   On: 04/10/2017 15:25   Dg Elbow Complete Right  Result Date: 04/10/2017 CLINICAL DATA:  Fall.  Bruising along posterior elbow. EXAM: RIGHT ELBOW - COMPLETE 3+ VIEW COMPARISON:  None. FINDINGS: No acute bony abnormality. Specifically, no fracture, subluxation, or dislocation. Soft tissues are intact. No joint effusion. IMPRESSION: No acute bony abnormality. Electronically Signed   By: Charlett Nose M.D.   On: 04/10/2017 15:22   Ct Head Wo Contrast  Result Date: 04/10/2017 CLINICAL DATA:  83 year old post fall today. Altered level of consciousness. Initial encounter. EXAM: CT HEAD WITHOUT CONTRAST CT CERVICAL SPINE WITHOUT CONTRAST TECHNIQUE: Multidetector CT imaging of the head and cervical spine was performed following the standard protocol without intravenous contrast. Multiplanar CT image reconstructions of the cervical spine were also generated. COMPARISON:  None.  FINDINGS: CT HEAD FINDINGS Brain: No intracranial hemorrhage or CT evidence of large acute infarct. Chronic microvascular changes. Mild age related atrophy. No intracranial mass lesion noted on this unenhanced exam. Vascular: Vascular calcifications. Skull: No skull fracture.  Hyperostosis frontalis interna. Sinuses/Orbits: No acute orbital abnormality. Visualized paranasal sinuses are clear. Other: Mastoid air cells and middle ear cavities are clear. Mild temporomandibular joint degenerative changes. CT CERVICAL SPINE FINDINGS Alignment: Slight curvature cervical spine. Skull base and vertebrae: No cervical spine fracture. Soft tissues and spinal canal: No abnormal prevertebral soft tissue swelling. Disc levels: Multilevel cervical spondylotic changes with various degrees spinal stenosis and foraminal narrowing. Fusion C3-4 facet joint. Upper chest: Scarring with calcification Other: Goiter with calcified lesions measuring up to 2.5 cm. IMPRESSION: No skull fracture or intracranial hemorrhage. No cervical spine fracture or abnormal prevertebral soft tissue swelling. Chronic changes as detailed above. Electronically Signed   By: Lacy Duverney M.D.   On: 04/10/2017 15:53   Ct Cervical Spine Wo Contrast  Result Date: 04/10/2017 CLINICAL DATA:  83 year old post fall today. Altered level of consciousness. Initial encounter. EXAM: CT HEAD WITHOUT CONTRAST CT CERVICAL SPINE WITHOUT CONTRAST TECHNIQUE: Multidetector CT imaging of the head and cervical spine was performed following the standard protocol without intravenous contrast. Multiplanar CT image reconstructions of the cervical spine were also generated. COMPARISON:  None. FINDINGS: CT HEAD FINDINGS Brain: No intracranial hemorrhage or CT evidence of large acute infarct. Chronic microvascular changes. Mild age related atrophy. No intracranial mass lesion noted on this unenhanced exam. Vascular: Vascular calcifications. Skull: No skull fracture.  Hyperostosis  frontalis interna. Sinuses/Orbits: No acute orbital abnormality. Visualized paranasal sinuses are clear. Other: Mastoid air cells and middle ear cavities are clear. Mild temporomandibular joint degenerative changes. CT CERVICAL SPINE FINDINGS Alignment: Slight curvature cervical spine. Skull base and vertebrae: No cervical spine fracture. Soft tissues and spinal canal: No abnormal prevertebral soft tissue swelling. Disc levels: Multilevel cervical spondylotic changes with various degrees spinal stenosis and foraminal narrowing. Fusion C3-4 facet joint. Upper chest: Scarring with calcification Other: Goiter with calcified lesions measuring up to 2.5 cm. IMPRESSION: No skull fracture or intracranial hemorrhage. No cervical spine fracture or abnormal prevertebral soft tissue swelling. Chronic changes as detailed above. Electronically Signed   By: Lacy Duverney M.D.   On: 04/10/2017 15:53   Mr Brain Wo Contrast  Result Date: 04/11/2017 CLINICAL DATA:  Altered mental status EXAM: MRI HEAD WITHOUT CONTRAST TECHNIQUE:  Multiplanar, multiecho pulse sequences of the brain and surrounding structures were obtained without intravenous contrast. COMPARISON:  Head CT 04/10/2017 FINDINGS: Brain: The midline structures are normal. There is no acute infarct or acute hemorrhage. No mass lesion, hydrocephalus, dural abnormality or extra-axial collection. There is diffuse confluent white matter hyperintensity consistent with chronic microvascular ischemia. Multiple prominent prevascular spaces. Multiple old infarcts of the cerebellum, brainstem and basal ganglia. No age-advanced or lobar predominant atrophy. No chronic microhemorrhage or superficial siderosis. Vascular: Major intracranial arterial and venous sinus flow voids are preserved. Skull and upper cervical spine: The visualized skull base, calvarium, upper cervical spine and extracranial soft tissues are normal. Sinuses/Orbits: No fluid levels or advanced mucosal  thickening. No mastoid or middle ear effusion. Normal orbits. IMPRESSION: 1. No acute abnormality of the brain. 2. Advanced chronic microvascular ischemia and multiple small, old infarcts of the deep gray structures, brainstem and cerebellum. Electronically Signed   By: Deatra RobinsonKevin  Herman M.D.   On: 04/11/2017 03:48    2D ECHO:   Disposition and Follow-up: Discharge Instructions    Diet - low sodium heart healthy   Complete by:  As directed    Increase activity slowly   Complete by:  As directed        DISPOSITION: Home with home health   DISCHARGE FOLLOW-UP Follow-up Information    Schedule an appointment as soon as possible for a visit with Catha GosselinLittle, Kevin, MD.   Specialty:  Family Medicine Why:  Misty StanleyLisa will call Julie @ home Contact information: [address] [place] [address] [phone]        [facility] Follow up.   Specialty:  Home Health Services Why:  They will do your home health care at your home Contact information: [address] [place] [address] [place] [address] [phone]            Time spent on Discharge: 25 minutes  Signed:   Thad Rangeripudeep Tamara Monteith M.D. [facility] 04/11/2017, 12:34 PM Pager: [phone]

## 2017-07-14 ENCOUNTER — Encounter (HOSPITAL_COMMUNITY): Payer: Self-pay

## 2017-07-14 ENCOUNTER — Emergency Department (HOSPITAL_COMMUNITY)
Admission: EM | Admit: 2017-07-14 | Discharge: 2017-07-14 | Disposition: A | Discharge status: Home / Self Care | Payer: Medicare Other | Attending: Emergency Medicine | Admitting: Emergency Medicine

## 2017-07-14 ENCOUNTER — Other Ambulatory Visit: Payer: Self-pay

## 2017-07-14 ENCOUNTER — Emergency Department (HOSPITAL_COMMUNITY): Payer: Medicare Other

## 2017-07-14 DIAGNOSIS — I509 Heart failure, unspecified: Secondary | ICD-10-CM | POA: Diagnosis not present

## 2017-07-14 DIAGNOSIS — I10 Essential (primary) hypertension: Secondary | ICD-10-CM | POA: Diagnosis not present

## 2017-07-14 DIAGNOSIS — R4182 Altered mental status, unspecified: Secondary | ICD-10-CM

## 2017-07-14 DIAGNOSIS — Z79899 Other long term (current) drug therapy: Secondary | ICD-10-CM | POA: Insufficient documentation

## 2017-07-14 DIAGNOSIS — Z7982 Long term (current) use of aspirin: Secondary | ICD-10-CM | POA: Diagnosis not present

## 2017-07-14 LAB — CBC WITH DIFFERENTIAL/PLATELET
Basophils Absolute: 0 10*3/uL (ref 0.0–0.1)
Basophils Relative: 0 %
Eosinophils Absolute: 0.1 10*3/uL (ref 0.0–0.7)
Eosinophils Relative: 1 %
HCT: 37.6 % (ref 36.0–46.0)
Hemoglobin: 11.6 g/dL — ABNORMAL LOW (ref 12.0–15.0)
Lymphocytes Relative: 14 %
Lymphs Abs: 0.9 10*3/uL (ref 0.7–4.0)
MCH: 28.6 pg (ref 26.0–34.0)
MCHC: 30.9 g/dL (ref 30.0–36.0)
MCV: 92.8 fL (ref 78.0–100.0)
Monocytes Absolute: 0.4 10*3/uL (ref 0.1–1.0)
Monocytes Relative: 6 %
Neutro Abs: 4.9 10*3/uL (ref 1.7–7.7)
Neutrophils Relative %: 79 %
Platelets: 220 10*3/uL (ref 150–400)
RBC: 4.05 MIL/uL (ref 3.87–5.11)
RDW: 13.9 % (ref 11.5–15.5)
WBC: 6.2 10*3/uL (ref 4.0–10.5)

## 2017-07-14 LAB — URINALYSIS, ROUTINE W REFLEX MICROSCOPIC
Bilirubin Urine: NEGATIVE
Glucose, UA: NEGATIVE mg/dL
Hgb urine dipstick: NEGATIVE
Ketones, ur: 5 mg/dL — AB
Leukocytes, UA: NEGATIVE
Nitrite: NEGATIVE
Protein, ur: NEGATIVE mg/dL
Specific Gravity, Urine: 1.011 (ref 1.005–1.030)
pH: 6 (ref 5.0–8.0)

## 2017-07-14 LAB — BASIC METABOLIC PANEL
Anion gap: 12 (ref 5–15)
BUN: 35 mg/dL — ABNORMAL HIGH (ref 6–20)
CO2: 29 mmol/L (ref 22–32)
Calcium: 10 mg/dL (ref 8.9–10.3)
Chloride: 104 mmol/L (ref 101–111)
Creatinine, Ser: 1.54 mg/dL — ABNORMAL HIGH (ref 0.44–1.00)
GFR calc Af Amer: 34 mL/min — ABNORMAL LOW (ref >60)
GFR calc non Af Amer: 29 mL/min — ABNORMAL LOW (ref >60)
Glucose, Bld: 111 mg/dL — ABNORMAL HIGH (ref 65–99)
Potassium: 3.6 mmol/L (ref 3.5–5.1)
Sodium: 145 mmol/L (ref 135–145)

## 2017-07-14 MED ORDER — LORAZEPAM 1 MG PO TABS: 0.5000 mg | ORAL_TABLET | Freq: Three times a day (TID) | ORAL | 0 refills | Status: AC | PRN

## 2017-07-14 NOTE — ED Notes (Signed)
Patient transported to CT 

## 2017-07-14 NOTE — ED Notes (Signed)
Bed: WA20 Expected date:  Expected time:  Means of arrival:  Comments: TR 1 please

## 2017-07-14 NOTE — ED Triage Notes (Signed)
Pt BIB family. Per granddaughter, pt and husband have recently moved in to a nursing home. Pt has reportedly tried to run out of home multiple times. Family reports that she was very combative this morning and didn't know who anyone was. They report that they are unsure if she has fallen recently. They are also concerned about a possible UTI.

## 2017-07-14 NOTE — ED Provider Notes (Signed)
[facility]-EMERGENCY DEPT Provider Note   CSN: [id number] Arrival date & time: 07/14/17  1236     History   Chief Complaint Chief Complaint  Patient presents with  . Altered Mental Status  . Aggressive Behavior    Dementia    HPI Julie WYNN is a 84 y.o. female.  HPI   84 year old female brought in by her family for evaluation of some aggressive behavior.  Her and her husband were moved to a nursing facility approximately 2 weeks ago.  She has been trying to leave it at times.  Today she tried to walk down was stopped by staff.  She became combative.  Family says this is out of character for her.  She is also fallen a couple times in the past few weeks but they do not suspect any significant trauma from these falls.  No falls in the last 2 days.  Patient herself has no acute complaints.  Family does not think any medicines have been changed recently her appetite is been good.  She denies any urinary complaints.  Past Medical History:  Diagnosis Date  . Bronchitis   . CHF (congestive heart failure) (HCC)   . Chronic back pain   . Depression   . Headache(784.0)   . Heart valve disease   . Hypertension   . Hypothyroidism   . Pelvic fracture (HCC) 06/2016   no pelvic surgery   . Renal disorder     Patient Active Problem List   Diagnosis Date Noted  . Acute encephalopathy 04/10/2017  . Goiter 04/10/2017  . Chronic diastolic congestive heart failure (HCC) 06/25/2015  . Pelvic ring fracture (HCC) 06/25/2015  . Fracture of pubic ramus (HCC) 06/24/2015  . Fall 06/24/2015  . Weight loss 12/31/2014  . Transaminitis 06/03/2011  . Hypotension 06/01/2011  . Diverticulitis 06/01/2011  . CKD (chronic kidney disease), stage IV (HCC) 06/01/2011  . Lower urinary tract infectious disease 06/01/2011  . BRONCHITIS 09/13/2007  . MACULAR DEGENERATION 09/08/2007  . DEAFNESS 09/08/2007  . Essential hypertension 09/08/2007  . Aortic valve disorder 09/08/2007  .  ALLERGIC RHINITIS 09/08/2007  . HEADACHE, CHRONIC 09/08/2007  . History of cardiovascular disorder 09/08/2007  . DYSPNEA 08/14/2007    Past Surgical History:  Procedure Laterality Date  . ABDOMINAL HYSTERECTOMY    . HERNIA REPAIR    . TONSILLECTOMY      OB History    No data available       Home Medications    Prior to Admission medications   Medication Sig Start Date End Date Taking? Authorizing Provider  aspirin 81 MG chewable tablet Chew 81 mg by mouth daily.    [provider]  cefUROXime (CEFTIN) 250 MG tablet Take 1 tablet (250 mg total) by mouth 2 (two) times daily with a meal. X 3 days 04/11/17   Rai, Ripudeep K, MD  FLUoxetine (PROZAC) 40 MG capsule Take 40 mg by mouth daily.    [provider]  furosemide (LASIX) 40 MG tablet Take 1 tablet (40 mg total) by mouth daily. 06/08/11   Hongalgi, Maximino Greenland, MD  losartan (COZAAR) 25 MG tablet Take 25 mg by mouth daily.    [provider]  morphine (MSIR) 15 MG tablet Take 1 tablet (15 mg total) by mouth 2 (two) times daily. 04/11/17   Rai, Delene Ruffini, MD  Multiple Vitamins-Minerals (OCUVITE ADULT 50+) CAPS Take 1 tablet by mouth daily.    [provider]  temazepam (RESTORIL) 15 MG  capsule Take 15 mg by mouth at bedtime.     [provider]    Family History Family History  Problem Relation Age of Onset  . Stroke Mother   . Lung cancer Father     Social History Social History   Tobacco Use  . Smoking status: Never Smoker  . Smokeless tobacco: Never Used  Substance Use Topics  . Alcohol use: No    Alcohol/week: 0.0 oz  . Drug use: No     Allergies   Other; Sulfamethoxazole; Ciprofloxacin; and Sulfa antibiotics   Review of Systems Review of Systems  All systems reviewed and negative, other than as noted in HPI.  Physical Exam Updated Vital Signs Pulse 76   Temp 97.7 F (36.5 C) (Oral)   Resp 15   SpO2 95%   Physical Exam  Constitutional: She appears  well-developed and well-nourished. No distress.  HENT:  Head: Normocephalic and atraumatic.  Eyes: Conjunctivae are normal. Right eye exhibits no discharge. Left eye exhibits no discharge.  Neck: Neck supple.  Cardiovascular: Normal rate, regular rhythm and normal heart sounds. Exam reveals no gallop and no friction rub.  No murmur heard. Pulmonary/Chest: Effort normal and breath sounds normal. No respiratory distress.  Abdominal: Soft. She exhibits no distension. There is no tenderness.  Musculoskeletal: She exhibits no edema or tenderness.  Neurological: She is alert.  Very hard of hearing. R pupil non-reactive. CN 2-12 otherwise intact. Speech is clear. Content appropriate. Follows commands. Strength 5/5 b/l U/L extremities.   Skin: Skin is warm and dry.  Psychiatric: She has a normal mood and affect. Her behavior is normal. Thought content normal.  Nursing note and vitals reviewed.    ED Treatments / Results  Labs (all labs ordered are listed, but only abnormal results are displayed) Labs Reviewed - No data to display  EKG  EKG Interpretation None       Radiology Ct Head Wo Contrast  Result Date: 07/14/2017 CLINICAL DATA:  Altered level of consciousness, combative EXAM: CT HEAD WITHOUT CONTRAST TECHNIQUE: Contiguous axial images were obtained from the base of the skull through the vertex without intravenous contrast. COMPARISON:  MR brain dated 04/11/2017 FINDINGS: Brain: No evidence of acute infarction, hemorrhage, hydrocephalus, extra-axial collection or mass lesion/mass effect. Subcortical white matter and periventricular small vessel ischemic changes. Old left cerebellar lacunar infarcts. Vascular: Intracranial atherosclerosis. Skull: Normal. Negative for fracture or focal lesion. Sinuses/Orbits: The visualized paranasal sinuses are essentially clear. The mastoid air cells are unopacified. Other: None. IMPRESSION: No evidence of acute intracranial abnormality. Old left  cerebellar lacunar infarcts. Small vessel ischemic changes. Electronically Signed   By: Charline Bills M.D.   On: 07/14/2017 14:32    Procedures Procedures (including critical care time)  Medications Ordered in ED Medications - No data to display   Initial Impression / Assessment and Plan / ED Course  I have reviewed the triage vital signs and the nursing notes.  Pertinent labs & imaging results that were available during my care of the patient were reviewed by me and considered in my medical decision making (see chart for details).     84 year old female with some combativeness.  I am not sure this may be from significant change in her environment.  CT head done given recent falls but without acute abnormality.  Patient herself has no acute complaints.  Will obtain screening labs and urinalysis.  Daughter now at bedside. Discussed that I don't have a clear explanation for what may have precipitated  her earlier behavior. I'm not sure how much of this is may be related to new environment, some degree of dementia, etc. She is requesting medication. Advised PCP further discussion but over weekend will give low dose benzo PRN agitation until she can follow-up.   Final Clinical Impressions(s) / ED Diagnoses   Final diagnoses:  Altered mental status, unspecified altered mental status type    ED Discharge Orders    None       Raeford RazorKohut, Aritha Huckeba, MD 07/15/17 [phone]

## 2017-09-29 ENCOUNTER — Encounter (HOSPITAL_COMMUNITY): Payer: Self-pay

## 2017-09-29 ENCOUNTER — Inpatient Hospital Stay (HOSPITAL_COMMUNITY)
Admission: EM | Admit: 2017-09-29 | Discharge: 2017-10-01 | DRG: 193 | Disposition: A | Discharge status: Home / Self Care | Payer: Medicare Other | Attending: Family Medicine | Admitting: Family Medicine

## 2017-09-29 ENCOUNTER — Other Ambulatory Visit: Payer: Self-pay

## 2017-09-29 ENCOUNTER — Emergency Department (HOSPITAL_COMMUNITY): Payer: Medicare Other

## 2017-09-29 DIAGNOSIS — J181 Lobar pneumonia, unspecified organism: Principal | ICD-10-CM | POA: Diagnosis present

## 2017-09-29 DIAGNOSIS — J9601 Acute respiratory failure with hypoxia: Secondary | ICD-10-CM | POA: Diagnosis not present

## 2017-09-29 DIAGNOSIS — R51 Headache: Secondary | ICD-10-CM | POA: Diagnosis present

## 2017-09-29 DIAGNOSIS — Z9109 Other allergy status, other than to drugs and biological substances: Secondary | ICD-10-CM | POA: Diagnosis not present

## 2017-09-29 DIAGNOSIS — Z79899 Other long term (current) drug therapy: Secondary | ICD-10-CM

## 2017-09-29 DIAGNOSIS — Z881 Allergy status to other antibiotic agents status: Secondary | ICD-10-CM

## 2017-09-29 DIAGNOSIS — I350 Nonrheumatic aortic (valve) stenosis: Secondary | ICD-10-CM | POA: Diagnosis present

## 2017-09-29 DIAGNOSIS — F329 Major depressive disorder, single episode, unspecified: Secondary | ICD-10-CM | POA: Diagnosis present

## 2017-09-29 DIAGNOSIS — Z7982 Long term (current) use of aspirin: Secondary | ICD-10-CM | POA: Diagnosis not present

## 2017-09-29 DIAGNOSIS — E039 Hypothyroidism, unspecified: Secondary | ICD-10-CM | POA: Diagnosis present

## 2017-09-29 DIAGNOSIS — G8929 Other chronic pain: Secondary | ICD-10-CM | POA: Diagnosis present

## 2017-09-29 DIAGNOSIS — I5032 Chronic diastolic (congestive) heart failure: Secondary | ICD-10-CM | POA: Diagnosis present

## 2017-09-29 DIAGNOSIS — I130 Hypertensive heart and chronic kidney disease with heart failure and stage 1 through stage 4 chronic kidney disease, or unspecified chronic kidney disease: Secondary | ICD-10-CM | POA: Diagnosis present

## 2017-09-29 DIAGNOSIS — Z882 Allergy status to sulfonamides status: Secondary | ICD-10-CM | POA: Diagnosis not present

## 2017-09-29 DIAGNOSIS — J9621 Acute and chronic respiratory failure with hypoxia: Secondary | ICD-10-CM | POA: Diagnosis present

## 2017-09-29 DIAGNOSIS — Z79891 Long term (current) use of opiate analgesic: Secondary | ICD-10-CM | POA: Diagnosis not present

## 2017-09-29 DIAGNOSIS — M545 Low back pain: Secondary | ICD-10-CM | POA: Diagnosis present

## 2017-09-29 DIAGNOSIS — N184 Chronic kidney disease, stage 4 (severe): Secondary | ICD-10-CM | POA: Diagnosis present

## 2017-09-29 DIAGNOSIS — I359 Nonrheumatic aortic valve disorder, unspecified: Secondary | ICD-10-CM | POA: Diagnosis present

## 2017-09-29 DIAGNOSIS — J189 Pneumonia, unspecified organism: Secondary | ICD-10-CM

## 2017-09-29 LAB — CBC WITH DIFFERENTIAL/PLATELET
Abs Immature Granulocytes: 0 10*3/uL (ref 0.0–0.1)
Basophils Absolute: 0 10*3/uL (ref 0.0–0.1)
Basophils Relative: 0 %
EOS ABS: 0.2 10*3/uL (ref 0.0–0.7)
Eosinophils Relative: 2 %
HEMATOCRIT: 32.7 % — AB (ref 36.0–46.0)
Hemoglobin: 10.1 g/dL — ABNORMAL LOW (ref 12.0–15.0)
IMMATURE GRANULOCYTES: 0 %
LYMPHS ABS: 0.7 10*3/uL (ref 0.7–4.0)
Lymphocytes Relative: 7 %
MCH: 27.5 pg (ref 26.0–34.0)
MCHC: 30.9 g/dL (ref 30.0–36.0)
MCV: 89.1 fL (ref 78.0–100.0)
Monocytes Absolute: 0.8 10*3/uL (ref 0.1–1.0)
Monocytes Relative: 8 %
NEUTROS PCT: 83 %
Neutro Abs: 8.7 10*3/uL — ABNORMAL HIGH (ref 1.7–7.7)
PLATELETS: 236 10*3/uL (ref 150–400)
RBC: 3.67 MIL/uL — AB (ref 3.87–5.11)
RDW: 13.5 % (ref 11.5–15.5)
WBC: 10.5 10*3/uL (ref 4.0–10.5)

## 2017-09-29 LAB — COMPREHENSIVE METABOLIC PANEL
ALBUMIN: 3.1 g/dL — AB (ref 3.5–5.0)
ALT: 12 U/L — ABNORMAL LOW (ref 14–54)
AST: 17 U/L (ref 15–41)
Alkaline Phosphatase: 95 U/L (ref 38–126)
Anion gap: 10 (ref 5–15)
BUN: 35 mg/dL — ABNORMAL HIGH (ref 6–20)
CHLORIDE: 100 mmol/L — AB (ref 101–111)
CO2: 30 mmol/L (ref 22–32)
CREATININE: 2.09 mg/dL — AB (ref 0.44–1.00)
Calcium: 9.7 mg/dL (ref 8.9–10.3)
GFR calc Af Amer: 23 mL/min — ABNORMAL LOW (ref >60)
GFR calc non Af Amer: 20 mL/min — ABNORMAL LOW (ref >60)
GLUCOSE: 111 mg/dL — AB (ref 65–99)
Potassium: 3.8 mmol/L (ref 3.5–5.1)
Sodium: 140 mmol/L (ref 135–145)
Total Bilirubin: 1 mg/dL (ref 0.3–1.2)
Total Protein: 6.6 g/dL (ref 6.5–8.1)

## 2017-09-29 LAB — I-STAT CG4 LACTIC ACID, ED
Lactic Acid, Venous: 0.79 mmol/L (ref 0.5–1.9)
Lactic Acid, Venous: 1.25 mmol/L (ref 0.5–1.9)

## 2017-09-29 LAB — BRAIN NATRIURETIC PEPTIDE: B NATRIURETIC PEPTIDE 5: 674.4 pg/mL — AB (ref 0.0–100.0)

## 2017-09-29 MED ORDER — SODIUM CHLORIDE 0.9% FLUSH
3.0000 mL | Freq: Two times a day (BID) | INTRAVENOUS | Status: DC
  Administered 2017-09-29 – 2017-10-01 (×4): 3 mL via INTRAVENOUS

## 2017-09-29 MED ORDER — SODIUM CHLORIDE 0.9 % IV SOLN
500.0000 mg | Freq: Once | INTRAVENOUS | Status: AC
  Administered 2017-09-29: 500 mg via INTRAVENOUS
  Filled 2017-09-29: qty 500

## 2017-09-29 MED ORDER — SODIUM CHLORIDE 0.9 % IV SOLN
1.0000 g | Freq: Once | INTRAVENOUS | Status: AC
  Administered 2017-09-29: 1 g via INTRAVENOUS
  Filled 2017-09-29: qty 10

## 2017-09-29 MED ORDER — ACETAMINOPHEN 650 MG RE SUPP: 650.0000 mg | Freq: Four times a day (QID) | RECTAL | Status: DC | PRN

## 2017-09-29 MED ORDER — ACETAMINOPHEN 325 MG PO TABS: 650.0000 mg | ORAL_TABLET | Freq: Four times a day (QID) | ORAL | Status: DC | PRN

## 2017-09-29 MED ORDER — LORAZEPAM 0.5 MG PO TABS
0.5000 mg | ORAL_TABLET | Freq: Three times a day (TID) | ORAL | Status: DC | PRN
  Administered 2017-09-29 – 2017-10-01 (×3): 0.5 mg via ORAL
  Filled 2017-09-29 (×3): qty 1

## 2017-09-29 MED ORDER — ASPIRIN 81 MG PO CHEW
81.0000 mg | CHEWABLE_TABLET | Freq: Every day | ORAL | Status: DC
  Administered 2017-09-29 – 2017-10-01 (×3): 81 mg via ORAL
  Filled 2017-09-29 (×3): qty 1

## 2017-09-29 MED ORDER — FLUOXETINE HCL 20 MG PO CAPS
40.0000 mg | ORAL_CAPSULE | Freq: Every day | ORAL | Status: DC
  Administered 2017-09-29 – 2017-10-01 (×3): 40 mg via ORAL
  Filled 2017-09-29 (×3): qty 2

## 2017-09-29 MED ORDER — SODIUM CHLORIDE 0.9 % IV SOLN: 250.0000 mL | INTRAVENOUS | Status: DC | PRN

## 2017-09-29 MED ORDER — SODIUM CHLORIDE 0.9% FLUSH
3.0000 mL | Freq: Two times a day (BID) | INTRAVENOUS | Status: DC
  Administered 2017-09-30 – 2017-10-01 (×2): 3 mL via INTRAVENOUS

## 2017-09-29 MED ORDER — SODIUM CHLORIDE 0.9% FLUSH: 3.0000 mL | INTRAVENOUS | Status: DC | PRN

## 2017-09-29 MED ORDER — LOSARTAN POTASSIUM 50 MG PO TABS
25.0000 mg | ORAL_TABLET | Freq: Every day | ORAL | Status: DC
  Administered 2017-09-29: 25 mg via ORAL
  Filled 2017-09-29: qty 1

## 2017-09-29 MED ORDER — TEMAZEPAM 15 MG PO CAPS
15.0000 mg | ORAL_CAPSULE | Freq: Every day | ORAL | Status: DC
  Administered 2017-10-01: 15 mg via ORAL
  Filled 2017-09-29: qty 1

## 2017-09-29 MED ORDER — FUROSEMIDE 40 MG PO TABS
40.0000 mg | ORAL_TABLET | Freq: Every day | ORAL | Status: DC
  Administered 2017-09-29 – 2017-10-01 (×3): 40 mg via ORAL
  Filled 2017-09-29 (×3): qty 1

## 2017-09-29 NOTE — H&P (Addendum)
History and Physical  Julie Mclean ZOX:[id number] DOB: 04/07/1928 DOA: 09/29/2017  PCP: Catha Gosselin, MD   Chief Complaint: Short of breath  HPI:  84 year old woman PMH diastolic dysfunction, severe aortic stenosis presented to the emergency department with shortness of breath, hypoxia.  Admitted for lobar pneumonia and acute hypoxic respiratory failure.  History per patient and granddaughter.  Patient has been sick for about a week, her husband has had bronchitis.  She says shortness of breath and a wet productive cough.  Symptoms aggravated by ambulation and improved with rest.  No nausea or vomiting.  No other associated symptoms noted.  Occasionally does get choked on her food but does not follow any special diet.  ED Course: Treated with ceftriaxone and azithromycin  Review of Systems:  Negative for fever, new visual changes, sore throat, rash, new muscle aches, chest pain, dysuria, bleeding, n/v/abdominal pain.  Past Medical History:  Diagnosis Date  . Bronchitis   . CHF (congestive heart failure) (HCC)   . Chronic back pain   . Depression   . Headache(784.0)   . Heart valve disease   . Hypertension   . Hypothyroidism   . Pelvic fracture (HCC) 06/2016   no pelvic surgery   . Renal disorder     Past Surgical History:  Procedure Laterality Date  . ABDOMINAL HYSTERECTOMY    . HERNIA REPAIR    . TONSILLECTOMY       reports that she has never smoked. She has never used smokeless tobacco. She reports that she does not drink alcohol or use drugs. Mobility: Uses a cane  Allergies  Allergen Reactions  . Other Other (See Comments)    Reaction to FA dye  . Sulfamethoxazole Other (See Comments)    unknown  . Ciprofloxacin Rash  . Sulfa Antibiotics Rash    Family History  Problem Relation Age of Onset  . Stroke Mother   . Lung cancer Father      Prior to Admission medications   Medication Sig Start Date End Date Taking? Authorizing Provider  aspirin 81 MG  chewable tablet Chew 81 mg by mouth daily.    [provider]  cefUROXime (CEFTIN) 250 MG tablet Take 1 tablet (250 mg total) by mouth 2 (two) times daily with a meal. X 3 days 04/11/17   Rai, Ripudeep K, MD  FLUoxetine (PROZAC) 40 MG capsule Take 40 mg by mouth daily.    [provider]  furosemide (LASIX) 40 MG tablet Take 1 tablet (40 mg total) by mouth daily. 06/08/11   Hongalgi, Maximino Greenland, MD  LORazepam (ATIVAN) 1 MG tablet Take 0.5 tablets (0.5 mg total) by mouth 3 (three) times daily as needed for anxiety. 07/14/17   Raeford Razor, MD  losartan (COZAAR) 25 MG tablet Take 25 mg by mouth daily.    [provider]  morphine (MSIR) 15 MG tablet Take 1 tablet (15 mg total) by mouth 2 (two) times daily. 04/11/17   Rai, Delene Ruffini, MD  Multiple Vitamins-Minerals (OCUVITE ADULT 50+) CAPS Take 1 tablet by mouth daily.    [provider]  temazepam (RESTORIL) 15 MG capsule Take 15 mg by mouth at bedtime.     [provider]    Physical Exam: Vitals:   09/29/17 1730 09/29/17 1745  BP: (!) 130/58 (!) 133/54  Pulse: 78 78  Resp:  18  Temp:    SpO2: 100% 98%    Constitutional:   Appears calm and comfortable Eyes:  pupils and  irises appear normal Normal lids  ENMT:  grossly normal hearing  Lips appear normal Neck:  neck appears normal, no masses no thyromegaly Respiratory:  Right posterior crackles noted.  Fair air movement. Respiratory effort normal Cardiovascular:  RRR, no m/r/g No LE extremity edema   Abdomen:  Soft, nontender, nondistended.  No hepatomegaly.  No hernias noted. Musculoskeletal:  Digits/nails BUE: no clubbing, cyanosis, petechiae, infection RUE, LUE, RLE, LLE   strength and tone normal, no atrophy, no abnormal movements No tenderness, masses Skin:  No rashes, lesions, ulcers palpation of skin: no induration or nodules Psychiatric:  Mental status Mood, affect appropriate Slightly confused  I have personally  reviewed following labs and imaging studies  Labs:   BUN 35, creatinine 2.09.  LFTs unremarkable.  Lactic acid within normal limits.  Hemoglobin stable 10.1.  WBC 10.5.  Platelets within normal limits.  Urinalysis negative  Imaging studies:   Chest x-ray independently reviewed shows right lower lobe infiltrate.  Principal Problem:   Lobar pneumonia (HCC) Active Problems:   Aortic valve disorder   CKD (chronic kidney disease), stage IV (HCC)   Chronic diastolic CHF (congestive heart failure) (HCC)   Acute hypoxemic respiratory failure (HCC)   Assessment/Plan Right lower lobe pneumonia with associated acute hypoxic respiratory failure.  No evidence of sepsis. --Empiric antibiotics, pneumonia protocol --Supplemental oxygen, wean as tolerated --Speech therapy evaluation  Chronic kidney disease stage IV.  Appears to be stable.  Chronic diastolic congestive heart failure.  Appears euvolemic.  Severity of Illness: The appropriate patient status for this patient is INPATIENT. Inpatient status is judged to be reasonable and necessary in order to provide the required intensity of service to ensure the patient's safety. The patient's presenting symptoms, physical exam findings, and initial radiographic and laboratory data in the context of their chronic comorbidities is felt to place them at high risk for further clinical deterioration. Furthermore, it is not anticipated that the patient will be medically stable for discharge from the hospital within 2 midnights of admission. The following factors support the patient status of inpatient.    * I certify that at the point of admission it is my clinical judgment that the patient will require inpatient hospital care spanning beyond 2 midnights from the point of admission due to high intensity of service, high risk for further deterioration and high frequency of surveillance required.*     DVT prophylaxis:SCDs Code Status: full Family  Communication: granddaughter at bedside Consults called: none    Time spent: 66 minutes  Brendia Sacks, MD  [facility] Direct contact: [phone] --Via amion app OR  --[email or web address]; password [email or web address]  7PM-7AM contact night coverage as above  09/29/2017, 6:06 PM

## 2017-09-29 NOTE — ED Provider Notes (Signed)
[facility] EMERGENCY DEPARTMENT Provider Note  CSN: [id number] Arrival date & time: 09/29/17 1026  Chief Complaint(s) Pneumonia  HPI Julie Mclean is a 84 y.o. female with a history of diastolic heart failure, bronchitis, previous pneumonia who lives in a skilled nursing facility presents to the emergency department with approximately 1 to 1-1/2 weeks of coughing that has gradually worsened over the past 4 to 5 days.  Cough is wet but nonproductive.  Also report mild shortness of breath.  They do not report any associated fevers or chills.  No nausea or vomiting.  No chest pain.  No abdominal pain.  No urinary symptoms.  Patient endorses lower extremity edema over the past several weeks.  She was seen by her primary care provider earlier this morning who noted right lower lobe rales.  Patient was noted to be hypoxic in the low 80s on room air.  She was placed on 3 L nasal cannula and sent here for further evaluation.  HPI  Past Medical History Past Medical History:  Diagnosis Date  . Bronchitis   . CHF (congestive heart failure) (HCC)   . Chronic back pain   . Depression   . Headache(784.0)   . Heart valve disease   . Hypertension   . Hypothyroidism   . Pelvic fracture (HCC) 06/2016   no pelvic surgery   . Renal disorder    Patient Active Problem List   Diagnosis Date Noted  . Acute encephalopathy 04/10/2017  . Goiter 04/10/2017  . Chronic diastolic congestive heart failure (HCC) 06/25/2015  . Pelvic ring fracture (HCC) 06/25/2015  . Fracture of pubic ramus (HCC) 06/24/2015  . Fall 06/24/2015  . Weight loss 12/31/2014  . Transaminitis 06/03/2011  . Hypotension 06/01/2011  . Diverticulitis 06/01/2011  . CKD (chronic kidney disease), stage IV (HCC) 06/01/2011  . Lower urinary tract infectious disease 06/01/2011  . BRONCHITIS 09/13/2007  . MACULAR DEGENERATION 09/08/2007  . DEAFNESS 09/08/2007  . Essential hypertension 09/08/2007  . Aortic valve  disorder 09/08/2007  . ALLERGIC RHINITIS 09/08/2007  . HEADACHE, CHRONIC 09/08/2007  . History of cardiovascular disorder 09/08/2007  . DYSPNEA 08/14/2007   Home Medication(s) Prior to Admission medications   Medication Sig Start Date End Date Taking? Authorizing Provider  aspirin 81 MG chewable tablet Chew 81 mg by mouth daily.    [provider]  cefUROXime (CEFTIN) 250 MG tablet Take 1 tablet (250 mg total) by mouth 2 (two) times daily with a meal. X 3 days 04/11/17   Rai, Ripudeep K, MD  FLUoxetine (PROZAC) 40 MG capsule Take 40 mg by mouth daily.    [provider]  furosemide (LASIX) 40 MG tablet Take 1 tablet (40 mg total) by mouth daily. 06/08/11   Hongalgi, Maximino Greenland, MD  LORazepam (ATIVAN) 1 MG tablet Take 0.5 tablets (0.5 mg total) by mouth 3 (three) times daily as needed for anxiety. 07/14/17   Raeford Razor, MD  losartan (COZAAR) 25 MG tablet Take 25 mg by mouth daily.    [provider]  morphine (MSIR) 15 MG tablet Take 1 tablet (15 mg total) by mouth 2 (two) times daily. 04/11/17   Rai, Delene Ruffini, MD  Multiple Vitamins-Minerals (OCUVITE ADULT 50+) CAPS Take 1 tablet by mouth daily.    [provider]  temazepam (RESTORIL) 15 MG capsule Take 15 mg by mouth at bedtime.     [provider]  Past Surgical History Past Surgical History:  Procedure Laterality Date  . ABDOMINAL HYSTERECTOMY    . HERNIA REPAIR    . TONSILLECTOMY     Family History Family History  Problem Relation Age of Onset  . Stroke Mother   . Lung cancer Father     Social History Social History   Tobacco Use  . Smoking status: Never Smoker  . Smokeless tobacco: Never Used  Substance Use Topics  . Alcohol use: No    Alcohol/week: 0.0 oz  . Drug use: No   Allergies Other; Sulfamethoxazole; Ciprofloxacin; and Sulfa  antibiotics  Review of Systems Review of Systems All other systems are reviewed and are negative for acute change except as noted in the HPI  Physical Exam Vital Signs  I have reviewed the triage vital signs BP 108/84 (BP Location: Right Arm)   Pulse 71   Temp 99.5 F (37.5 C) (Oral)   Resp 14   Ht  (1.6 m)   Wt 50.8 kg (112 lb)   SpO2 93%   BMI 19.84 kg/m   Physical Exam  Constitutional: She is oriented to person, place, and time. She appears well-developed and well-nourished. No distress.  HENT:  Head: Normocephalic and atraumatic.  Nose: Nose normal.  Eyes: Pupils are equal, round, and reactive to light. Conjunctivae and EOM are normal. Right eye exhibits no discharge. Left eye exhibits no discharge. No scleral icterus.  Neck: Normal range of motion. Neck supple.  Cardiovascular: Normal rate and regular rhythm. Exam reveals no gallop and no friction rub.  No murmur heard. Pulmonary/Chest: Effort normal. No stridor. No respiratory distress. She has rales in the right lower field.  Abdominal: Soft. She exhibits no distension. There is no tenderness.  Musculoskeletal: She exhibits no edema or tenderness.  Neurological: She is alert and oriented to person, place, and time.  Skin: Skin is warm and dry. No rash noted. She is not diaphoretic. No erythema.  Psychiatric: She has a normal mood and affect.  Vitals reviewed.   ED Results and Treatments Labs (all labs ordered are listed, but only abnormal results are displayed) Labs Reviewed  CBC WITH DIFFERENTIAL/PLATELET - Abnormal; Notable for the following components:      Result Value   RBC 3.67 (*)    Hemoglobin 10.1 (*)    HCT 32.7 (*)    Neutro Abs 8.7 (*)    All other components within normal limits  COMPREHENSIVE METABOLIC PANEL - Abnormal; Notable for the following components:   Chloride 100 (*)    Glucose, Bld 111 (*)    BUN 35 (*)    Creatinine, Ser 2.09 (*)    Albumin 3.1 (*)    ALT 12 (*)    GFR calc  non Af Amer 20 (*)    GFR calc Af Amer 23 (*)    All other components within normal limits  CULTURE, BLOOD (ROUTINE X 2)  CULTURE, BLOOD (ROUTINE X 2)  BRAIN NATRIURETIC PEPTIDE  I-STAT CG4 LACTIC ACID, ED  I-STAT CG4 LACTIC ACID, ED  EKG  EKG Interpretation  Date/Time:    Ventricular Rate:    PR Interval:    QRS Duration:   QT Interval:    QTC Calculation:   R Axis:     Text Interpretation:        Radiology Dg Chest 2 View  Result Date: 09/29/2017 CLINICAL DATA:  Cough and weakness for 1 week. EXAM: CHEST - 2 VIEW COMPARISON:  Prior today had 05/29/2017. FINDINGS: Cardiac silhouette is top-normal in size. No mediastinal or hilar masses. No convincing adenopathy. Opacity noted the right lung base on the earlier exam is less defined on the current study, although there is still relative increased posterior lung base opacity best seen on the lateral view. Lungs are hyperexpanded but otherwise clear. No pleural effusion or pneumothorax. Skeletal structures are demineralized but grossly intact. Surgical vascular clips are noted in the epigastric region, stable. IMPRESSION: 1. Increased opacity posterior lung base most likely the right lower lobe, less apparent than it was on the earlier exam. This could be due to chronic bronchitic changes, pneumonia or a combination. 2. No other evidence of acute cardiopulmonary disease. Electronically Signed   By: Amie Portland M.D.   On: 09/29/2017 11:30   Pertinent labs & imaging results that were available during my care of the patient were reviewed by me and considered in my medical decision making (see chart for details).  Medications Ordered in ED Medications  cefTRIAXone (ROCEPHIN) 1 g in sodium chloride 0.9 % 100 mL IVPB (has no administration in time range)  azithromycin (ZITHROMAX) 500 mg in sodium chloride 0.9 % 250 mL IVPB  (has no administration in time range)                                                                                                                                    Procedures Procedures CRITICAL CARE Performed by: Amadeo Garnet Kaslyn [place] Total critical care time: 30 minutes Critical care time was exclusive of separately billable procedures and treating other patients. Critical care was necessary to treat or prevent imminent or life-threatening deterioration. Critical care was time spent personally by me on the following activities: development of treatment plan with patient and/or surrogate as well as nursing, discussions with consultants, evaluation of patient's response to treatment, examination of patient, obtaining history from patient or surrogate, ordering and performing treatments and interventions, ordering and review of laboratory studies, ordering and review of radiographic studies, pulse oximetry and re-evaluation of patient's condition.   (including critical care time)  Medical Decision Making / ED Course I have reviewed the nursing notes for this encounter and the patient's prior records (if available in EHR or on provided paperwork).    Patient is afebrile with stable vital signs but noted to be hypoxic on room air with sats in the 80s on room air.  Placed on 2 L nasal cannula.  Exam with right lower lobe crackles.  Chest x-ray  confirmed right lower lobe pneumonia.  Labs grossly reassuring without leukocytosis or elevated lactic acid suspicious for severe sepsis.  Attempt to wean the patient off of oxygen was unsuccessful.  She is still requiring 2 L nasal cannula.  We will start the patient on empiric antibiotics for community-acquired pneumonia.  Will discuss case with medicine for admission and continued management.  Final Clinical Impression(s) / ED Diagnoses Final diagnoses:  Community acquired pneumonia of right lower lobe of lung (HCC)      This chart was  dictated using voice recognition software.  Despite best efforts to proofread,  errors can occur which can change the documentation meaning.   Nira Conn, MD 09/29/17 [phone]

## 2017-09-29 NOTE — ED Triage Notes (Signed)
Pt sent in from Dr. Darrol Poke office where she was found to be hypoxic and xray revealed pneumonia. Pt granddaughter reports she has been coughing at home and sob at times

## 2017-09-30 LAB — BASIC METABOLIC PANEL
ANION GAP: 9 (ref 5–15)
BUN: 39 mg/dL — ABNORMAL HIGH (ref 6–20)
CALCIUM: 9.2 mg/dL (ref 8.9–10.3)
CHLORIDE: 101 mmol/L (ref 101–111)
CO2: 31 mmol/L (ref 22–32)
Creatinine, Ser: 1.89 mg/dL — ABNORMAL HIGH (ref 0.44–1.00)
GFR calc non Af Amer: 23 mL/min — ABNORMAL LOW (ref >60)
GFR, EST AFRICAN AMERICAN: 26 mL/min — AB (ref >60)
Glucose, Bld: 109 mg/dL — ABNORMAL HIGH (ref 65–99)
Potassium: 3.9 mmol/L (ref 3.5–5.1)
SODIUM: 141 mmol/L (ref 135–145)

## 2017-09-30 LAB — CBC
HCT: 31.3 % — ABNORMAL LOW (ref 36.0–46.0)
HEMOGLOBIN: 9.5 g/dL — AB (ref 12.0–15.0)
MCH: 27.4 pg (ref 26.0–34.0)
MCHC: 30.4 g/dL (ref 30.0–36.0)
MCV: 90.2 fL (ref 78.0–100.0)
Platelets: 230 10*3/uL (ref 150–400)
RBC: 3.47 MIL/uL — ABNORMAL LOW (ref 3.87–5.11)
RDW: 13.5 % (ref 11.5–15.5)
WBC: 10.1 10*3/uL (ref 4.0–10.5)

## 2017-09-30 LAB — STREP PNEUMONIAE URINARY ANTIGEN: Strep Pneumo Urinary Antigen: NEGATIVE

## 2017-09-30 MED ORDER — SODIUM CHLORIDE 0.9 % IV SOLN
500.0000 mg | INTRAVENOUS | Status: DC
  Administered 2017-09-30: 500 mg via INTRAVENOUS
  Filled 2017-09-30 (×2): qty 500

## 2017-09-30 MED ORDER — CEFTRIAXONE SODIUM 1 G IJ SOLR
1.0000 g | INTRAMUSCULAR | Status: DC
  Administered 2017-09-30: 1 g via INTRAVENOUS
  Filled 2017-09-30 (×2): qty 10

## 2017-09-30 NOTE — Progress Notes (Addendum)
  PROGRESS NOTE  Julie Mclean:[id number] DOB: [birth year] DOA: 09/29/2017 PCP: Catha Gosselin, MD  Brief Narrative: 84 year old woman PMH diastolic dysfunction, severe aortic stenosis presented to the emergency department with shortness of breath, hypoxia.  Admitted for lobar pneumonia and acute hypoxic respiratory failure.  Assessment/Plan Right lower lobe pneumonia with associated acute hypoxic respiratory failure --Subjectively improved but still has high oxygen requirement. Will continue empiric abx, oxygen and wean as tolerated.  Chronic kidney disease stage IV, appears to be at baseline.  Chronic diastolic congestive heart failure --Appears euvolemic.  Continue furosemide.    DVT prophylaxis: SCDs Code Status: full Family Communication: none Disposition Plan: return to ALF 1-2 days depending on response to antibiotics and oxygen wean.   Brendia Sacks, MD  [facility] Direct contact: [phone] --Via amion app OR  --[email or web address]; password [email or web address]  7PM-7AM contact night coverage as above 09/30/2017, 9:06 AM  LOS: 1 day   Consultants:    Procedures:    Antimicrobials:  Ceftriaxone 5/17 >>  Azithromycin 5/17 >>  Interval history/Subjective: Feels better today.  Breathing better.  Objective: Vitals:  Vitals:   09/29/17 2021 09/30/17 0615  BP: (!) 173/74 (!) 155/62  Pulse: (!) 103 74  Resp: 20 18  Temp: 98.6 F (37 C) 97.6 F (36.4 C)  SpO2: 95% 95%    Exam:  Constitutional:  . Appears calm and comfortable Eyes:  . pupils and irises appear normal . Normal lids  ENMT:  . Very hard of hearing . Lips appear normal Respiratory:  . Right-sided inspiratory crackles.  No wheezes or rhonchi. Marland Kitchen Respiratory effort normal.  Cardiovascular:  . RRR, 2/6 systolic murmur.  Right upper sternal border.  No rub or gallop. . No LE extremity edema   Musculoskeletal:  . RLE, LLE   . strength and tone normal Psychiatric:  . Mental status o Mood,  affect appropriate  I have personally reviewed the following:   Labs:  Basic metabolic panel unremarkable.  Creatinine appears to be at baseline, 1.89.  Hemoglobin stable, 9.5.  WBC within normal limits.  Platelets within normal limits.  Scheduled Meds: . aspirin  81 mg Oral Daily  . FLUoxetine  40 mg Oral Daily  . furosemide  40 mg Oral Daily  . sodium chloride flush  3 mL Intravenous Q12H  . sodium chloride flush  3 mL Intravenous Q12H  . temazepam  15 mg Oral QHS   Continuous Infusions: . sodium chloride      Principal Problem:   Lobar pneumonia (HCC) Active Problems:   Aortic valve disorder   CKD (chronic kidney disease), stage IV (HCC)   Chronic diastolic CHF (congestive heart failure) (HCC)   Acute hypoxemic respiratory failure (HCC)   LOS: 1 day

## 2017-09-30 NOTE — Evaluation (Signed)
Clinical/Bedside Swallow Evaluation Patient Details  Name: Julie Mclean MRN: [id number] Date of Birth: 1927/09/03  Today's Date: 09/30/2017 Time: SLP Start Time (ACUTE ONLY): 1710 SLP Stop Time (ACUTE ONLY): 1725 SLP Time Calculation (min) (ACUTE ONLY): 15 min  Past Medical History:  Past Medical History:  Diagnosis Date  . Bronchitis   . CHF (congestive heart failure) (HCC)   . Chronic back pain   . Depression   . Headache(784.0)   . Heart valve disease   . Hypertension   . Hypothyroidism   . Pelvic fracture (HCC) 06/2016   no pelvic surgery   . Renal disorder    Past Surgical History:  Past Surgical History:  Procedure Laterality Date  . ABDOMINAL HYSTERECTOMY    . HERNIA REPAIR    . TONSILLECTOMY     HPI:  84 year old woman PMH diastolic dysfunction, severe aortic stenosis presented to the emergency department with shortness of breath, hypoxia.  Admitted for lobar pneumonia and acute hypoxic respiratory failure. Esophagram 06/28/2001 revealed small hiatal hernia, web-like smooth narrowing, spontaneous gastroesophageal reflux reaching the level of the mid esophagus. CXR revealed Increased opacity posterior lung base most likely the right lower lobe   Assessment / Plan / Recommendation Clinical Impression  Patient presents with oropharyngeal swallow which appears at bedside to be within functional limits with adequate airway protection. For limited amount of POs consumed, no overt signs of aspiration observed. Pt unable to take consecutive straw sips of thin liquids in excess of 3oz. Pt denies history of swallowing difficulties, however she does have a remote history of esophageal issues per chart (2003), putting her at mild risk for aspiration. Will advance to regular diet with thin liquids; will follow briefly for tolerance with a full meal.   SLP Visit Diagnosis: Dysphagia, unspecified (R13.10)    Aspiration Risk  Mild aspiration risk    Diet Recommendation Regular;Thin  liquid   Liquid Administration via: Cup;Straw Medication Administration: Whole meds with liquid Supervision: Patient able to self feed Compensations: Slow rate;Small sips/bites Postural Changes: Seated upright at 90 degrees;Remain upright for at least 30 minutes after po intake    Other  Recommendations Oral Care Recommendations: Oral care BID   Follow up Recommendations None      Frequency and Duration min 1 x/week  1 week       Prognosis Prognosis for Safe Diet Advancement: Good      Swallow Study   General Date of Onset: 09/29/17 HPI: 84 year old woman PMH diastolic dysfunction, severe aortic stenosis presented to the emergency department with shortness of breath, hypoxia.  Admitted for lobar pneumonia and acute hypoxic respiratory failure. Esophagram 06/28/2001 revealed small hiatal hernia, web-like smooth narrowing, spontaneous gastroesophageal reflux reaching the level of the mid esophagus. CXR revealed Increased opacity posterior lung base most likely the right lower lobe Type of Study: Bedside Swallow Evaluation Previous Swallow Assessment: see HPI Diet Prior to this Study: Dysphagia 2 (chopped);Thin liquids Temperature Spikes Noted: No Respiratory Status: Nasal cannula History of Recent Intubation: No Behavior/Cognition: Alert;Cooperative;Pleasant mood;Other (Comment)(Hard of hearing) Oral Cavity Assessment: Within Functional Limits Oral Care Completed by SLP: No Oral Cavity - Dentition: Adequate natural dentition Vision: Functional for self-feeding Self-Feeding Abilities: Able to feed self Patient Positioning: Upright in bed Baseline Vocal Quality: Low vocal intensity Volitional Cough: Strong Volitional Swallow: Unable to elicit((hard of hearing))    Oral/Motor/Sensory Function Overall Oral Motor/Sensory Function: Within functional limits   Ice Chips Ice chips: Not tested   Thin Liquid Thin Liquid: Within functional  limits Presentation: Cup;Straw;Self Fed     Nectar Thick Nectar Thick Liquid: Not tested   Honey Thick Honey Thick Liquid: Not tested   Puree Puree: Within functional limits Presentation: Self Fed;Spoon   Solid   GO   Solid: Within functional limits Presentation: Self Fed       Rondel Baton, MS, CCC-SLP Speech-Language Pathologist [phone]  Julie Mclean 09/30/2017,5:34 PM

## 2017-10-01 MED ORDER — AZITHROMYCIN 500 MG PO TABS: 500.0000 mg | ORAL_TABLET | Freq: Every day | ORAL | 0 refills | Status: AC

## 2017-10-01 MED ORDER — AZITHROMYCIN 500 MG PO TABS: 500.0000 mg | ORAL_TABLET | Freq: Every day | ORAL | 0 refills | Status: DC

## 2017-10-01 MED ORDER — CEFUROXIME AXETIL 500 MG PO TABS: 500.0000 mg | ORAL_TABLET | Freq: Every day | ORAL | 0 refills | Status: AC

## 2017-10-01 MED ORDER — CEFUROXIME AXETIL 500 MG PO TABS: 500.0000 mg | ORAL_TABLET | Freq: Two times a day (BID) | ORAL | 0 refills | Status: DC

## 2017-10-01 NOTE — Discharge Summary (Signed)
Physician Discharge Summary  Julie Mclean WUJ:[id number] DOB: May 31, 1927 DOA: 09/29/2017  PCP: Catha Gosselin, MD  Admit date: 09/29/2017 Discharge date: 10/01/2017  Recommendations for Outpatient Follow-up:  1. Resolution of right lower lobe pneumonia with associated acute hypoxic respiratory failure --Oxygen requirement decreased but still desaturates with ambulation.  Will discharge on 2 L nasal cannula.  Wean as tolerated.  Follow-up Information    Little, Caryn Bee, MD. Schedule an appointment as soon as possible for a visit in 1 week(s).   Specialty:  Family Medicine Contact information: [address] [place] [address] [phone]          Follow-up Information    Little, Caryn Bee, MD. Schedule an appointment as soon as possible for a visit in 1 week(s).   Specialty:  Family Medicine Contact information: [address] [place] [address] [phone]            Discharge Diagnoses:  1. Right lower lobe pneumonia with associated acute hypoxic respiratory failure 2. Chronic kidney disease stage IV 3. Chronic diastolic congestive heart failure  Discharge Condition: improved Disposition: return to ALF  Diet recommendation: heart healthy  Filed Weights   09/29/17 1046 09/29/17 2021  Weight: 50.8 kg (112 lb) 50.7 kg (111 lb 11.2 oz)    History of present illness:  84 year old woman PMH diastolic dysfunction, severe aortic stenosis presented to the emergency department with shortness of breath, hypoxia. Admitted for lobar pneumonia and acute hypoxic respiratory failure.  Hospital Course:  Patient was treated with empiric antibiotics with rapid clinical improvement.  Hypoxic with ambulation on discharge, therefore discharged on 2 L nasal cannula.  Hospitalization was uncomplicated.  Individual issues as below.  Right lower lobe pneumonia with associated acute hypoxic respiratory failure --Rapidly improving.  Afebrile.  Hemodynamic stable. --Oxygen  requirement decreased but still desaturates with ambulation.  Will discharge on 2 L nasal cannula.  Chronic kidney disease stage IV, remained stable  Chronic diastolic congestive heart failure --Remained euvolemic.  Continue furosemide.  Antimicrobials:  Ceftriaxone 5/17 >> 5/18  Ceftin 5/19 >> 5/21  Azithromycin 5/17 >> 5/21  Today's assessment: S: Feels better.  Breathing fine.  No complaints. O: Vitals:  Vitals:   09/30/17 2120 10/01/17 0509  BP: 134/61 (!) 151/51  Pulse: 67 63  Resp: (!) 24 20  Temp: 98.1 F (36.7 C) 97.6 F (36.4 C)  SpO2: 98% 97%    Constitutional:  . Appears calm and comfortable Respiratory:  . Some inspiratory crackles, especially on right.  No rhonchi or wheezes.  Good air movement. Marland Kitchen Respiratory effort normal.   Cardiovascular:  . RRR, 2/6 systolic murmur.  No rub or gallop. . No LE extremity edema   Psychiatric:  . Mental status o Mood, affect appropriate  Blood cultures no growth to date  Discharge Instructions  Discharge Instructions    Diet - low sodium heart healthy   Complete by:  As directed    Discharge instructions   Complete by:  As directed    Call your physician or seek immediate medical attention for shortness of breath, wheezing, fever, worsening of condition.   Increase activity slowly   Complete by:  As directed      Allergies as of 10/01/2017      Reactions   Other Other (See Comments)   Unknown reaction to FA dye   Sulfamethoxazole Other (See Comments)   Unknown reaction   Ciprofloxacin Rash   Sulfa Antibiotics Rash      Medication List  TAKE these medications   azithromycin 500 MG tablet Commonly known as:  ZITHROMAX Take 1 tablet (500 mg total) by mouth at bedtime for 3 doses. Take 1 tablet daily for 3 days.   cefUROXime 500 MG tablet Commonly known as:  CEFTIN Take 1 tablet (500 mg total) by mouth at bedtime for 3 doses.   FLUoxetine 40 MG capsule Commonly known as:  PROZAC Take 40 mg by  mouth daily.   furosemide 40 MG tablet Commonly known as:  LASIX Take 1 tablet (40 mg total) by mouth daily.   IMODIUM A-D 2 MG capsule Generic drug:  loperamide Take 2 mg by mouth as needed for diarrhea or loose stools (do not exceed 16 mg/day).   LORazepam 1 MG tablet Commonly known as:  ATIVAN Take 0.5 tablets (0.5 mg total) by mouth 3 (three) times daily as needed for anxiety. What changed:    when to take this  reasons to take this   Melatonin 5 MG Tabs Take 5 mg by mouth at bedtime.   morphine 15 MG tablet Commonly known as:  MSIR Take 1 tablet (15 mg total) by mouth 2 (two) times daily.   multivitamin-lutein Caps capsule Take 1 capsule by mouth daily.   QUEtiapine 25 MG tablet Commonly known as:  SEROQUEL Take 25 mg by mouth at bedtime.            Durable Medical Equipment  (From admission, onward)        Start     Ordered   10/01/17 0910  For home use only DME oxygen  Once    Question Answer Comment  Mode or (Route) Nasal cannula   Liters per Minute 2   Frequency Continuous (stationary and portable oxygen unit needed)   Oxygen delivery system Gas      10/01/17 0909     Allergies  Allergen Reactions  . Other Other (See Comments)    Unknown reaction to FA dye  . Sulfamethoxazole Other (See Comments)    Unknown reaction  . Ciprofloxacin Rash  . Sulfa Antibiotics Rash    The results of significant diagnostics from this hospitalization (including imaging, microbiology, ancillary and laboratory) are listed below for reference.    Significant Diagnostic Studies: Dg Chest 2 View  Result Date: 09/29/2017 CLINICAL DATA:  Cough and weakness for 1 week. EXAM: CHEST - 2 VIEW COMPARISON:  Prior today had 05/29/2017. FINDINGS: Cardiac silhouette is top-normal in size. No mediastinal or hilar masses. No convincing adenopathy. Opacity noted the right lung base on the earlier exam is less defined on the current study, although there is still relative  increased posterior lung base opacity best seen on the lateral view. Lungs are hyperexpanded but otherwise clear. No pleural effusion or pneumothorax. Skeletal structures are demineralized but grossly intact. Surgical vascular clips are noted in the epigastric region, stable. IMPRESSION: 1. Increased opacity posterior lung base most likely the right lower lobe, less apparent than it was on the earlier exam. This could be due to chronic bronchitic changes, pneumonia or a combination. 2. No other evidence of acute cardiopulmonary disease. Electronically Signed   By: Amie Portland M.D.   On: 09/29/2017 11:30    Microbiology: Recent Results (from the past 240 hour(s))  Blood culture (routine x 2)     Status: None (Preliminary result)   Collection Time: 09/29/17 11:15 AM  Result Value Ref Range Status   Specimen Description BLOOD LEFT ANTECUBITAL  Final   Special Requests   Final  BOTTLES DRAWN AEROBIC AND ANAEROBIC Blood Culture adequate volume   Culture   Final    NO GROWTH < 24 HOURS Performed at [facility], [address], [place], [place] [address]    Report Status PENDING  Incomplete  Culture, blood (Routine X 2) w Reflex to ID Panel     Status: None (Preliminary result)   Collection Time: 09/29/17  5:32 PM  Result Value Ref Range Status   Specimen Description BLOOD RIGHT FOREARM  Final   Special Requests   Final    BOTTLES DRAWN AEROBIC ONLY Blood Culture adequate volume   Culture   Final    NO GROWTH < 24 HOURS Performed at [facility], [address], [place], [place] [address]    Report Status PENDING  Incomplete     Labs: Basic Metabolic Panel: Recent Labs  Lab 09/29/17 1115 09/30/17 0441  NA 140 141  K 3.8 3.9  CL 100* 101  CO2 30 31  GLUCOSE 111* 109*  BUN 35* 39*  CREATININE 2.09* 1.89*  CALCIUM 9.7 9.2   Liver Function Tests: Recent Labs  Lab 09/29/17 1115  AST 17  ALT 12*  ALKPHOS 95  BILITOT 1.0  PROT 6.6  ALBUMIN 3.1*   CBC: Recent  Labs  Lab 09/29/17 1115 09/30/17 0441  WBC 10.5 10.1  NEUTROABS 8.7*  --   HGB 10.1* 9.5*  HCT 32.7* 31.3*  MCV 89.1 90.2  PLT 236 230    Recent Labs    09/29/17 1731  BNP 674.4*    Principal Problem:   Lobar pneumonia (HCC) Active Problems:   Aortic valve disorder   CKD (chronic kidney disease), stage IV (HCC)   Chronic diastolic CHF (congestive heart failure) (HCC)   Acute hypoxemic respiratory failure (HCC)   Time coordinating discharge: 35 minutes  Signed:  Brendia Sacks, MD [facility] 10/01/2017, 9:10 AM

## 2017-10-01 NOTE — Progress Notes (Signed)
SATURATION QUALIFICATIONS: (This note is used to comply with regulatory documentation for home oxygen)  Patient Saturations on Room Air at Rest = 98%  Patient Saturations on Room Air while Ambulating = 85%  Patient Saturations on 2 Liters of oxygen while Ambulating = 95%  Please briefly explain why patient needs home oxygen:  Pt.  Desats with activity.

## 2017-10-01 NOTE — Clinical Social Work Note (Signed)
Clinical Social Work Assessment  Patient Details  Name: Julie Mclean MRN: [id number] Date of Birth: 1928-03-07  Date of referral:  10/01/17               Reason for consult:  Discharge Planning                Permission sought to share information with:  Facility Medical sales representative, Family Supports Permission granted to share information::  Yes, Verbal Permission Granted  Name::     Julie Mclean  Agency::  yes  Relationship::  yes  Contact Information:  yes  Housing/Transportation Living arrangements for the past 2 months:  Assisted Dealer of Information:  Adult Children Patient Interpreter Needed:  None Criminal Activity/Legal Involvement Pertinent to Current Situation/Hospitalization:  No - Comment as needed Significant Relationships:  Adult Children Lives with:  Spouse([facility] Do you feel safe going back to the place where you live?  Yes Need for family participation in patient care:  Yes (Comment)  Care giving concerns:  CSW received consult regarding patient's return to ALF.  Patient resides at ALF with husband.   Social Worker assessment / plan:  CSW spoke with patient at beside concerning her returning to ALF with husband.  Patient and daughter are agreeable for patient to return to ALF.  Employment status:  Retired Database administrator PT Recommendations:  Not assessed at this time Information / Referral to community resources:  (NA)  Patient/Family's Response to care:  Patient and patient daughter are in agreement with the discharge plan.  Patient/Family's Understanding of and Emotional Response to Diagnosis, Current Treatment, and Prognosis:  Daughter is realistic regarding patient's needs.  Patient's daughter expressed understanding of CSW role and discharge process as well as medical condition.  No questions or concerns about plan or treatment.  Emotional Assessment Appearance:  Appears stated  age Attitude/Demeanor/Rapport:  Engaged Affect (typically observed):  Appropriate Orientation:  Oriented to Self, Oriented to Place, Oriented to Situation Alcohol / Substance use:  Not Applicable Psych involvement (Current and /or in the community):  No (Comment)  Discharge Needs  Concerns to be addressed:  No discharge needs identified Readmission within the last 30 days:  No Current discharge risk:  Other(dementia) Barriers to Discharge:  No Barriers Identified   Julie Mclean, LCSWA 10/01/2017, 3:01 PM

## 2017-10-01 NOTE — Progress Notes (Signed)
CSW spoke with pt concening disposition planning.  Patient stated"returning home".  CSW will contacted RNCMM for disposition planning.  Budd Palmer LCSWA [phone]

## 2017-10-01 NOTE — Progress Notes (Signed)
Patient will Discharge To: [facility] Anticipated DC Date:10/01/17 Family Notified: Yes, Julie Mclean, daughter (bedside) Transport By: Daughter, Julie Mclean   Per MD patient ready for DC to [facility] . RN, patient, patient's family, and facility notified of DC. Assessment, Fl2/Pasrr, and Discharge Summary sent to facility. RN given number for report [phone]). DC packet on chart.    CSW signing off.  Budd Palmer LCSWA [phone]

## 2017-10-01 NOTE — NC FL2 (Signed)
[place] MEDICAID FL2 LEVEL OF CARE SCREENING TOOL     IDENTIFICATION  Patient Name: Julie Mclean Birthdate: 1928-04-28 Sex: female Admission Date (Current Location): 09/29/2017  [facility] and [place] Number:  Producer, television/film/video and Address:  [facility], [address] [place], [place] [address]      Provider Number: [id number]  Attending Physician Name and Address:  Standley Brooking, MD  Relative Name and Phone Number:  Basma Buchner [phone]    Current Level of Care: Hospital Recommended Level of Care: Assisted Living Facility Prior Approval Number:    Date Approved/Denied:   PASRR Number:    Discharge Plan: Other (Comment)([facility])    Current Diagnoses: Patient Active Problem List   Diagnosis Date Noted  . Lobar pneumonia (HCC) 09/29/2017  . Chronic diastolic CHF (congestive heart failure) (HCC) 09/29/2017  . Acute hypoxemic respiratory failure (HCC) 09/29/2017  . Acute encephalopathy 04/10/2017  . Goiter 04/10/2017  . Chronic diastolic congestive heart failure (HCC) 06/25/2015  . Pelvic ring fracture (HCC) 06/25/2015  . Fracture of pubic ramus (HCC) 06/24/2015  . Fall 06/24/2015  . Weight loss 12/31/2014  . Transaminitis 06/03/2011  . Hypotension 06/01/2011  . Diverticulitis 06/01/2011  . CKD (chronic kidney disease), stage IV (HCC) 06/01/2011  . Lower urinary tract infectious disease 06/01/2011  . BRONCHITIS 09/13/2007  . MACULAR DEGENERATION 09/08/2007  . DEAFNESS 09/08/2007  . Essential hypertension 09/08/2007  . Aortic valve disorder 09/08/2007  . ALLERGIC RHINITIS 09/08/2007  . HEADACHE, CHRONIC 09/08/2007  . History of cardiovascular disorder 09/08/2007  . DYSPNEA 08/14/2007    Orientation RESPIRATION BLADDER Height & Weight     Self, Situation, Place  O2 Continent Weight: 111 lb 11.2 oz (50.7 kg) Height:   (162.6 cm)  BEHAVIORAL SYMPTOMS/MOOD NEUROLOGICAL BOWEL NUTRITION STATUS  (NA) (NA) Continent  Diet  AMBULATORY STATUS COMMUNICATION OF NEEDS Skin   Limited Assist Verbally Normal                       Personal Care Assistance Level of Assistance  Dressing, Bathing Bathing Assistance: Maximum assistance   Dressing Assistance: Maximum assistance     Functional Limitations Info  Hearing   Hearing Info: Impaired      SPECIAL CARE FACTORS FREQUENCY  (NA)                    Contractures Contractures Info: Not present    Additional Factors Info  Allergies   Allergies Info: sulfamethoxazole, ciprofloxacin, sulfa antibiotics           Current Medications (10/01/2017):   TAKE these medications          azithromycin 500 MG tablet Commonly known as:  ZITHROMAX Take 1 tablet (500 mg total) by mouth at bedtime for 3 doses. Take 1 tablet daily for 3 days.   cefUROXime 500 MG tablet Commonly known as:  CEFTIN Take 1 tablet (500 mg total) by mouth at bedtime for 3 doses.   FLUoxetine 40 MG capsule Commonly known as:  PROZAC Take 40 mg by mouth daily.   furosemide 40 MG tablet Commonly known as:  LASIX Take 1 tablet (40 mg total) by mouth daily.   IMODIUM A-D 2 MG capsule Generic drug:  loperamide Take 2 mg by mouth as needed for diarrhea or loose stools (do not exceed 16 mg/day).   LORazepam 1 MG tablet Commonly known as:  ATIVAN Take 0.5 tablets (0.5 mg total)  by mouth 3 (three) times daily as needed for anxiety. What changed:    when to take this  reasons to take this   Melatonin 5 MG Tabs Take 5 mg by mouth at bedtime.   morphine 15 MG tablet Commonly known as:  MSIR Take 1 tablet (15 mg total) by mouth 2 (two) times daily.   multivitamin-lutein Caps capsule Take 1 capsule by mouth daily.   QUEtiapine 25 MG tablet Commonly known as:  SEROQUEL Take 25 mg by mouth at bedtime.                                 Durable Medical Equipment  (From admission, onward)               Start     Ordered   10/01/17  0910  For home use only DME oxygen  Once    Question Answer Comment  Mode or (Route) Nasal cannula   Liters per Minute 2   Frequency Continuous (stationary and portable oxygen unit needed)   Oxygen delivery system Gas      10/01/17      Discharge Medications: Please see discharge summary for a list of discharge medications.  Relevant Imaging Results:  Relevant Lab Results:   Additional Information SS# [id number]  Arvin Collard, [place]

## 2017-10-01 NOTE — Care Management Note (Addendum)
Case Management Note Donn Pierini RN, BSN Unit 4E-Case Manager-- weekend coverage [phone]  Patient Details  Name: Julie Mclean MRN: [id number] Date of Birth: [birth year]  Subjective/Objective:   Pt admitted acute hypoxic respiratory failure- pna               Action/Plan: PTA pt lived at home with spouse, uses a cane at home. Pt for d/c home today- noted home 02 order- pt has qualifying note in epic- call placed to [facility] with [facility] for DME need- portable tank to be delivered to room once pt approved with insurance for home 02- spoke with pt at bedside regarding home 02 arrangements- attempted to call daughter per pt request but daughter did not answer.  Update: 1430- notified by CSW- Delice Bison that pt is actually from [facility]- lives there with her husband- contact there is [place] at [phone]- call made to [facility] to confirm [facility] location- per Artist Pais pt is from [facility]- room 51. Will make sure pt has needed 02 for return to [facility]- call made to [facility] with [facility]- who confirmed pt has both portable tank and portable concentrator at bedside for discharge. - CM went to room where daughter is at bedside now- went over home 02 needs and portable DME that has been delivered- daughter states she will transport pt back to ALF- and will take both portable 02 tank and portable concentrator with her to the ALF-CM will alert Shawna at [facility] that pt will return with portable concentrator. CSW to see pt and daughter at bedside to assist with return to ALF.  Expected Discharge Date:  10/01/17               Expected Discharge Plan:  Home/Self Care  In-House Referral:  NA  Discharge planning Services  CM Consult  Post Acute Care Choice:  Durable Medical Equipment Choice offered to:  Patient  DME Arranged:  Oxygen DME Agency:  [facility]  HH Arranged:  NA HH Agency:  NA  Status of Service:  Completed, signed off  If discussed at Long Length of  Stay Meetings, dates discussed:    Discharge Disposition: Assisted Living   Additional Comments:  Darrold Span, RN 10/01/2017, 11:28 AM

## 2017-10-04 LAB — CULTURE, BLOOD (ROUTINE X 2)
Culture: NO GROWTH
Culture: NO GROWTH
Special Requests: ADEQUATE
Special Requests: ADEQUATE

## 2017-10-30 ENCOUNTER — Telehealth: Payer: Self-pay | Admitting: Interventional Cardiology

## 2017-10-30 NOTE — Telephone Encounter (Signed)
New Message:       Pt's daughter is requesting if we can possibly see the pt sooner. I have scheduled pt with Tyrone SageGerhardt on 7/30 but she states pt is just getting over pneumonia and is in assisted living and they are considering putting her with hospice. So if she can be seen sooner they would greatly appreciate it.

## 2017-10-30 NOTE — Telephone Encounter (Signed)
Spoke with pt's daughter and scheduled pt to see Dr. Katrinka BlazingSmith tomorrow.  Daughter appreciative for call.

## 2017-10-31 ENCOUNTER — Ambulatory Visit: Payer: Medicare Other | Admitting: Interventional Cardiology

## 2017-10-31 ENCOUNTER — Encounter: Payer: Self-pay | Admitting: Interventional Cardiology

## 2017-10-31 VITALS — BP 154/70 | HR 84 | Ht 63.0 in | Wt 108.0 lb

## 2017-10-31 DIAGNOSIS — N184 Chronic kidney disease, stage 4 (severe): Secondary | ICD-10-CM | POA: Diagnosis not present

## 2017-10-31 DIAGNOSIS — I5032 Chronic diastolic (congestive) heart failure: Secondary | ICD-10-CM | POA: Diagnosis not present

## 2017-10-31 DIAGNOSIS — R4689 Other symptoms and signs involving appearance and behavior: Secondary | ICD-10-CM

## 2017-10-31 DIAGNOSIS — I10 Essential (primary) hypertension: Secondary | ICD-10-CM | POA: Diagnosis not present

## 2017-10-31 DIAGNOSIS — R4189 Other symptoms and signs involving cognitive functions and awareness: Secondary | ICD-10-CM

## 2017-10-31 DIAGNOSIS — I359 Nonrheumatic aortic valve disorder, unspecified: Secondary | ICD-10-CM | POA: Diagnosis not present

## 2017-10-31 MED ORDER — LOSARTAN POTASSIUM 25 MG PO TABS: 25.0000 mg | ORAL_TABLET | Freq: Every day | ORAL | 3 refills | Status: AC

## 2017-10-31 NOTE — Progress Notes (Signed)
Cardiology Office Note    Date:  10/31/2017   ID:  Julie Mclean, DOB 01/14/1928, MRN [id number]  PCP:  Catha GosselinLittle, Kevin, MD  Cardiologist: Lesleigh NoeHenry W Smith III, MD   Chief Complaint  Patient presents with  . Cardiac Valve Problem    History of Present Illness:  Julie CleverlyMary B Mclean is a 84 y.o. female who presents for moderate to severe aortic stenosis, chronic diastolic heart failure, history of hypertension, and increasing frailty with aging.  Since last being seen, the patient has dramatically deteriorated physically and cognitively.  For the past 6 months she has been in the assisted living because she and her husband were no longer able to care for themselves independently.  She has developed paranoia and agitation.  Memory, hearing, and interactions have all deteriorated.  She is on chronic oxygen therapy.  She has difficulty ambulating.  She is not eating because she feels that others are trying to poison her.  Not sleeping well.   Past Medical History:  Diagnosis Date  . Bronchitis   . CHF (congestive heart failure) (HCC)   . Chronic back pain   . Depression   . Headache(784.0)   . Heart valve disease   . Hypertension   . Hypothyroidism   . Pelvic fracture (HCC) 06/2016   no pelvic surgery   . Renal disorder     Past Surgical History:  Procedure Laterality Date  . ABDOMINAL HYSTERECTOMY    . HERNIA REPAIR    . TONSILLECTOMY      Current Medications: Outpatient Medications Prior to Visit  Medication Sig Dispense Refill  . FLUoxetine (PROZAC) 40 MG capsule Take 40 mg by mouth daily.    . furosemide (LASIX) 40 MG tablet Take 1 tablet (40 mg total) by mouth daily.    Marland Kitchen. loperamide (IMODIUM A-D) 2 MG capsule Take 2 mg by mouth as needed for diarrhea or loose stools (do not exceed 16 mg/day).    . LORazepam (ATIVAN) 1 MG tablet Take 0.5 tablets (0.5 mg total) by mouth 3 (three) times daily as needed for anxiety. (Patient taking differently: Take 0.5 mg by mouth every 8 (eight)  hours as needed for anxiety (behaviors). ) 10 tablet 0  . Melatonin 5 MG TABS Take 5 mg by mouth at bedtime.    Marland Kitchen. morphine (MSIR) 15 MG tablet Take 1 tablet (15 mg total) by mouth 2 (two) times daily.    . multivitamin-lutein (OCUVITE-LUTEIN) CAPS capsule Take 1 capsule by mouth daily.    . QUEtiapine (SEROQUEL) 25 MG tablet Take 25 mg by mouth at bedtime.     No facility-administered medications prior to visit.      Allergies:   Other; Sulfamethoxazole; Ciprofloxacin; and Sulfa antibiotics   Social History   Socioeconomic History  . Marital status: Married    Spouse name: Not on file  . Number of children: Not on file  . Years of education: Not on file  . Highest education level: Not on file  Occupational History  . Not on file  Social Needs  . Financial resource strain: Not on file  . Food insecurity:    Worry: Not on file    Inability: Not on file  . Transportation needs:    Medical: Not on file    Non-medical: Not on file  Tobacco Use  . Smoking status: Never Smoker  . Smokeless tobacco: Never Used  Substance and Sexual Activity  . Alcohol use: No    Alcohol/week: 0.0 oz  .  Drug use: No  . Sexual activity: Not on file  Lifestyle  . Physical activity:    Days per week: Not on file    Minutes per session: Not on file  . Stress: Not on file  Relationships  . Social connections:    Talks on phone: Not on file    Gets together: Not on file    Attends religious service: Not on file    Active member of club or organization: Not on file    Attends meetings of clubs or organizations: Not on file    Relationship status: Not on file  Other Topics Concern  . Not on file  Social History Narrative  . Not on file     Family History:  The patient's family history includes Lung cancer in her father; Stroke in her mother.   ROS:   Please see the history of present illness.    Accompanied by her daughter and granddaughter.  Has had diarrhea, anorexia, cough, not eating,  and requiring continuous oxygen therapy. All other systems reviewed and are negative.   PHYSICAL EXAM:   VS:  BP (!) 154/70   Pulse 84   Ht 5\' 3"  (1.6 m)   Wt 108 lb (49 kg)   BMI 19.13 kg/m    GEN: Well nourished, well developed, in no acute distress  HEENT: normal  Neck: Unable to assess for JVD due to lack of cooperation.  Transmitted bilateral carotid bruits, or masses. Cardiac: Irregular heart beat.  4/6 crescendo decrescendo systolic murmur right upper sternal border and left mid border.  No diastolic murmur, rubs, or gallops,no edema . Respiratory:  clear to auscultation bilaterally, normal work of breathing GI: soft, nontender, nondistended, + BS MS: no deformity or atrophy  Skin: warm and dry, no rash Neuro:  Alert and Oriented x 3, Strength and sensation are intact Psych: euthymic mood, full affect  Wt Readings from Last 3 Encounters:  10/31/17 108 lb (49 kg)  09/29/17 111 lb 11.2 oz (50.7 kg)  04/11/17 112 lb 6.4 oz (51 kg)      Studies/Labs Reviewed:   EKG:  EKG  Not performed  Recent Labs: 04/11/2017: TSH 0.851 09/29/2017: ALT 12; B Natriuretic Peptide 674.4 09/30/2017: BUN 39; Creatinine, Ser 1.89; Hemoglobin 9.5; Platelets 230; Potassium 3.9; Sodium 141   Lipid Panel No results found for: CHOL, TRIG, HDL, CHOLHDL, VLDL, LDLCALC, LDLDIRECT  Additional studies/ records that were reviewed today include:  No new data. Review of records demonstrates that she is no longer on antihypertensive therapy.    ASSESSMENT:    1. Aortic valve disorder   2. Chronic diastolic congestive heart failure (HCC)   3. CKD (chronic kidney disease), stage IV (HCC)   4. Essential hypertension   5. Cognitive impairment   6. Aggressive behavior      PLAN:  In order of problems listed above:  1. Severe aortic stenosis with likely significant progression since last evaluation.  Most recent echo was less than 1 year ago when velocity across the aortic valve was 4.6 m/s.  She  now has obvious cachexia, altered mental state, and behavior disorder.  Not a candidate at her age for aortic valve replacement by TAVR.  Previously decided that this would not be an option before that this point.  I do believe she is a candidate for hospice and based purely on her diagnosis of severe aortic stenosis she has a limited survivability of less than 1 year and probably less than 6  months. 2. No obvious volume overload although diuretic therapy should be continued to prevent contribution to dyspnea from CHF. 3. Blood pressure is elevated, possibly related to chronic kidney disease and intrinsic hypertension.  Resume low-dose losartan 25 mg/day.  Basic metabolic panel in 1 week. 4. Elevated and needs  to control to 140/90 mmHg or less.  Losartan resumed. 5. This is her major current problem and will likely drive her survivability.  Patient is doing very poorly.  I would agree with hospice placement.  Symptomatic control of volume with furosemide.  Medication Adjustments/Labs and Tests Ordered: Current medicines are reviewed at length with the patient today.  Concerns regarding medicines are outlined above.  Medication changes, Labs and Tests ordered today are listed in the Patient Instructions below. Patient Instructions  Medication Instructions:  1) START Losartan 25mg  once daily  Labwork: You will need a BMP in about a week.  Please fax results to [phone].  Testing/Procedures: None  Follow-Up: Your physician recommends that you schedule a follow-up appointment as needed with Dr. Katrinka Blazing.    Any Other Special Instructions Will Be Listed Below (If Applicable).     If you need a refill on your cardiac medications before your next appointment, please call your pharmacy.      Signed, Lesleigh Noe, MD  10/31/2017 12:43 PM    [facility] [address] [place], [place], [place]  [address] Phone: [phone]; Fax: [phone]

## 2017-10-31 NOTE — Patient Instructions (Signed)
Medication Instructions:  1) START Losartan 25mg  once daily  Labwork: You will need a BMP in about a week.  Please fax results to [phone].  Testing/Procedures: None  Follow-Up: Your physician recommends that you schedule a follow-up appointment as needed with Dr. Katrinka BlazingSmith.    Any Other Special Instructions Will Be Listed Below (If Applicable).     If you need a refill on your cardiac medications before your next appointment, please call your pharmacy.

## 2017-11-03 ENCOUNTER — Emergency Department (HOSPITAL_COMMUNITY)

## 2017-11-03 ENCOUNTER — Other Ambulatory Visit: Payer: Self-pay

## 2017-11-03 ENCOUNTER — Emergency Department (HOSPITAL_COMMUNITY)
Admission: EM | Admit: 2017-11-03 | Discharge: 2017-11-03 | Disposition: A | Discharge status: Home / Self Care | Attending: Emergency Medicine | Admitting: Emergency Medicine

## 2017-11-03 DIAGNOSIS — I130 Hypertensive heart and chronic kidney disease with heart failure and stage 1 through stage 4 chronic kidney disease, or unspecified chronic kidney disease: Secondary | ICD-10-CM | POA: Diagnosis not present

## 2017-11-03 DIAGNOSIS — F0390 Unspecified dementia without behavioral disturbance: Secondary | ICD-10-CM | POA: Diagnosis not present

## 2017-11-03 DIAGNOSIS — N183 Chronic kidney disease, stage 3 (moderate): Secondary | ICD-10-CM | POA: Diagnosis not present

## 2017-11-03 DIAGNOSIS — E039 Hypothyroidism, unspecified: Secondary | ICD-10-CM | POA: Insufficient documentation

## 2017-11-03 DIAGNOSIS — Z79899 Other long term (current) drug therapy: Secondary | ICD-10-CM | POA: Diagnosis not present

## 2017-11-03 DIAGNOSIS — W19XXXA Unspecified fall, initial encounter: Secondary | ICD-10-CM | POA: Insufficient documentation

## 2017-11-03 DIAGNOSIS — I5032 Chronic diastolic (congestive) heart failure: Secondary | ICD-10-CM | POA: Diagnosis not present

## 2017-11-03 DIAGNOSIS — Y998 Other external cause status: Secondary | ICD-10-CM | POA: Insufficient documentation

## 2017-11-03 DIAGNOSIS — R443 Hallucinations, unspecified: Secondary | ICD-10-CM | POA: Insufficient documentation

## 2017-11-03 DIAGNOSIS — S0083XA Contusion of other part of head, initial encounter: Secondary | ICD-10-CM | POA: Insufficient documentation

## 2017-11-03 DIAGNOSIS — Y9302 Activity, running: Secondary | ICD-10-CM | POA: Insufficient documentation

## 2017-11-03 DIAGNOSIS — R451 Restlessness and agitation: Secondary | ICD-10-CM | POA: Diagnosis not present

## 2017-11-03 DIAGNOSIS — Y92128 Other place in nursing home as the place of occurrence of the external cause: Secondary | ICD-10-CM | POA: Insufficient documentation

## 2017-11-03 DIAGNOSIS — S0990XA Unspecified injury of head, initial encounter: Secondary | ICD-10-CM | POA: Diagnosis present

## 2017-11-03 LAB — URINALYSIS, ROUTINE W REFLEX MICROSCOPIC
Bilirubin Urine: NEGATIVE
Glucose, UA: NEGATIVE mg/dL
KETONES UR: NEGATIVE mg/dL
Nitrite: NEGATIVE
PROTEIN: 30 mg/dL — AB
Specific Gravity, Urine: 1.011 (ref 1.005–1.030)
pH: 6 (ref 5.0–8.0)

## 2017-11-03 LAB — CBG MONITORING, ED: GLUCOSE-CAPILLARY: 134 mg/dL — AB (ref 65–99)

## 2017-11-03 MED ORDER — MORPHINE SULFATE (PF) 4 MG/ML IV SOLN
4.0000 mg | Freq: Once | INTRAVENOUS | Status: AC
  Administered 2017-11-03: 4 mg via INTRAMUSCULAR
  Filled 2017-11-03: qty 1

## 2017-11-03 MED ORDER — LORAZEPAM 2 MG/ML IJ SOLN
1.0000 mg | Freq: Once | INTRAMUSCULAR | Status: AC
  Administered 2017-11-03: 1 mg via INTRAMUSCULAR
  Filled 2017-11-03: qty 1

## 2017-11-03 MED ORDER — HALOPERIDOL LACTATE 5 MG/ML IJ SOLN
2.0000 mg | Freq: Once | INTRAMUSCULAR | Status: AC
  Administered 2017-11-03: 2 mg via INTRAMUSCULAR
  Filled 2017-11-03: qty 1

## 2017-11-03 NOTE — ED Provider Notes (Signed)
Case was discussed with hospice by registered nurse.  Hospice will make a home visit today.  Patient stable for discharge.   Donnetta Hutchingook, Derika Eckles, MD 11/03/17 [phone]

## 2017-11-03 NOTE — ED Notes (Signed)
X-ray at bedside
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
## 2017-11-03 NOTE — ED Notes (Signed)
Report given to [facility]  

## 2017-11-03 NOTE — ED Notes (Signed)
Pt's grand-daughter at bedside at this time. Grand-daughter stated that she felt comfortable to sit with her without staff. Called bell at bedside.

## 2017-11-03 NOTE — ED Triage Notes (Signed)
Pt coming from [facility]. CC of fall. Pt was running down the hall from nightmare when she fell. Pt's baseline is confused and has hallucinations. Pt has left side hematoma on forehead, and bruise to R wrist. Pt not on blood thinners. Staff says patient is more aggressive than per usual.

## 2017-11-03 NOTE — Discharge Instructions (Addendum)
Follow-up with hospice.  Case was discussed with hospice by our registered nurse.  They will make a visit today.

## 2017-11-13 DEATH — deceased

## 2017-11-14 ENCOUNTER — Telehealth: Payer: Self-pay | Admitting: Interventional Cardiology

## 2017-11-14 NOTE — Telephone Encounter (Signed)
Thanks for updating me!  

## 2017-11-14 NOTE — Telephone Encounter (Signed)
Left message for pt's daughter to call back.   Need to know if pt had BMET drawn with facility or Hospice.

## 2017-11-14 NOTE — Telephone Encounter (Signed)
° °  Per daughter, patient expired 11/03/17. Message sent to HIM

## 2017-11-14 NOTE — Telephone Encounter (Signed)
Will route to Dr. Smith to make him aware. 

## 2017-12-12 ENCOUNTER — Ambulatory Visit: Payer: Medicare Other | Admitting: Nurse Practitioner

## 2018-01-24 IMAGING — DX DG HIP (WITH OR WITHOUT PELVIS) 2-3V*R*
3 series · 3 of 3 positions shown · non-contrast
Comparison: None.

CLINICAL DATA: Fell at home today.  Hip pain.

EXAM:
DG HIP (WITH OR WITHOUT PELVIS) 2-3V RIGHT

[pelvis ap]
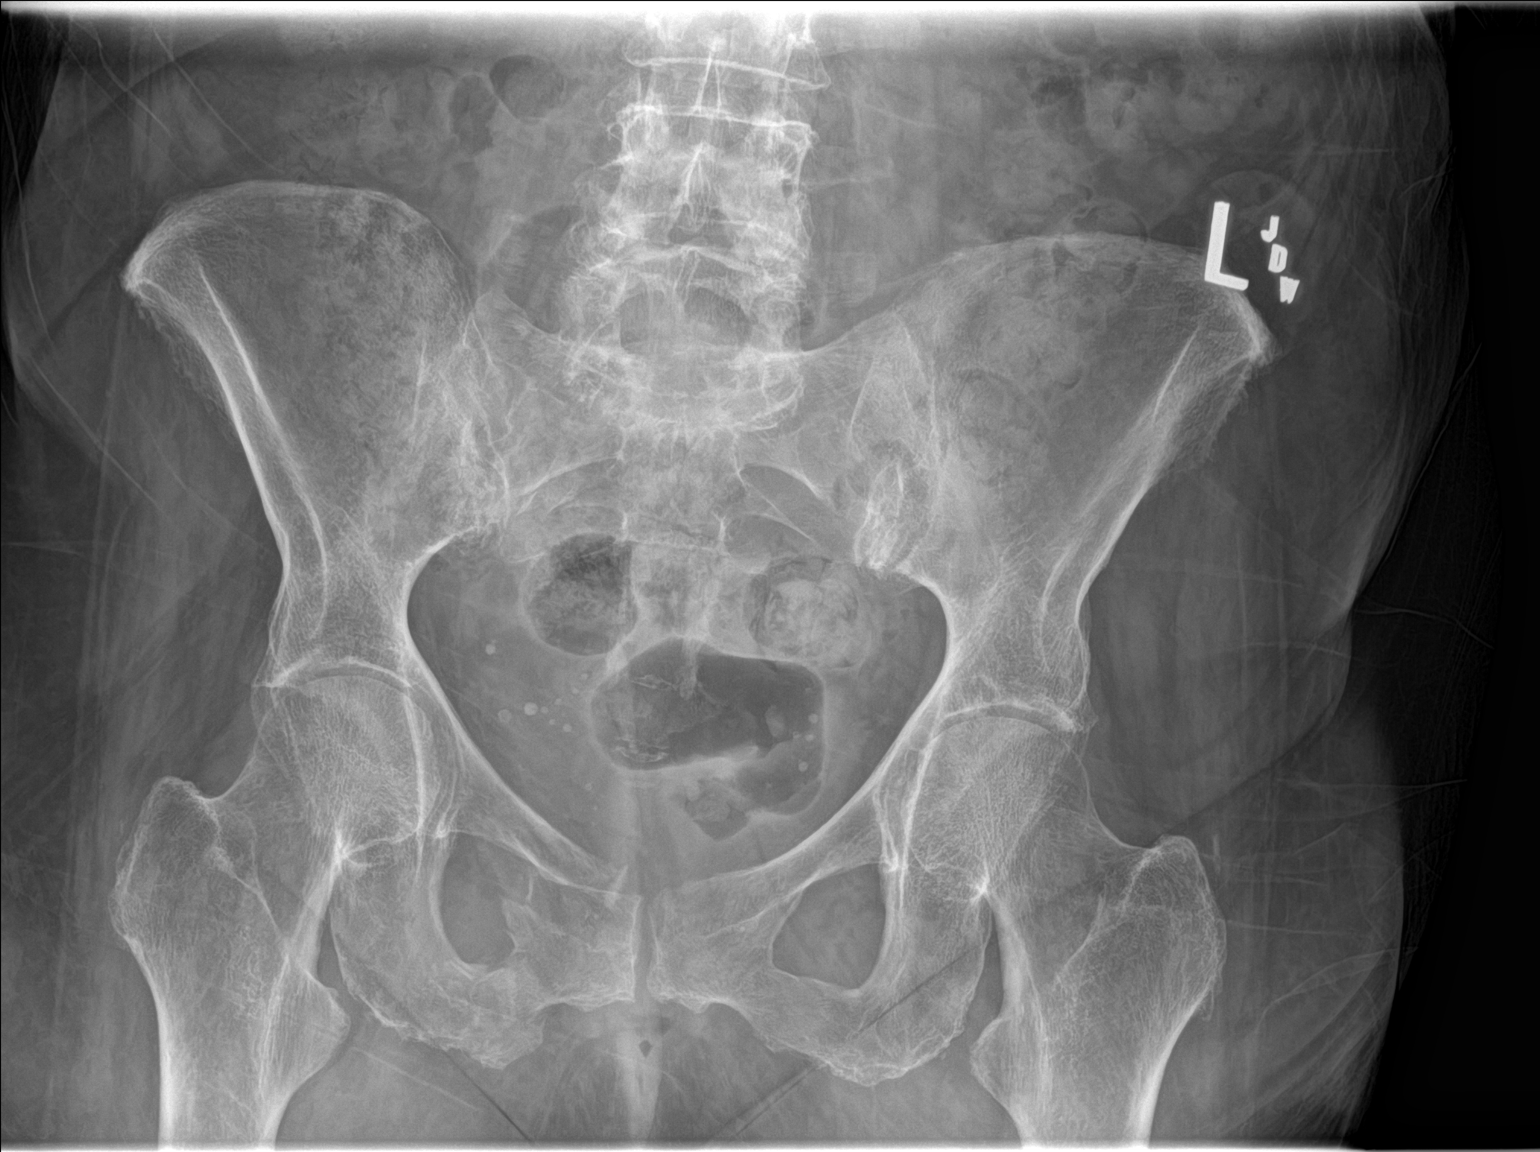

[hip ap]
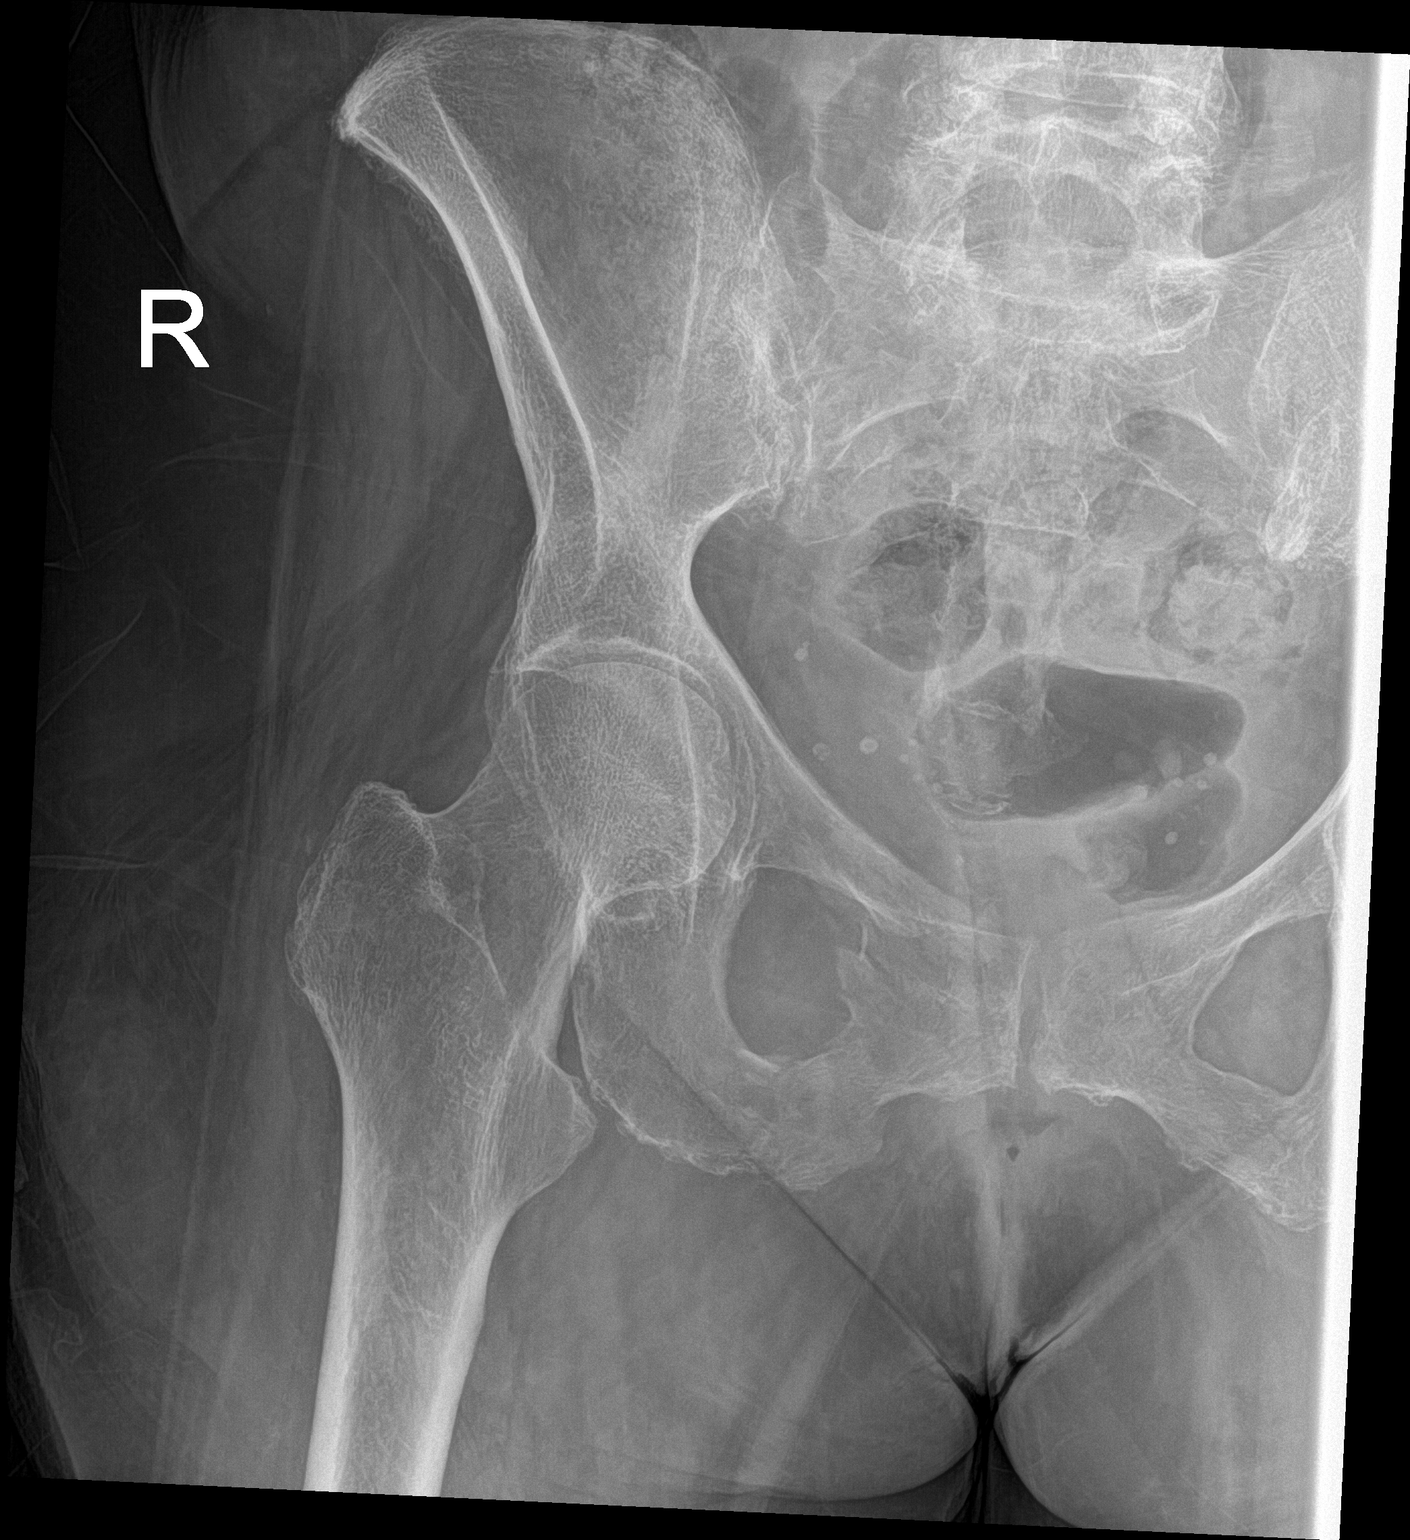

[hip x-table]
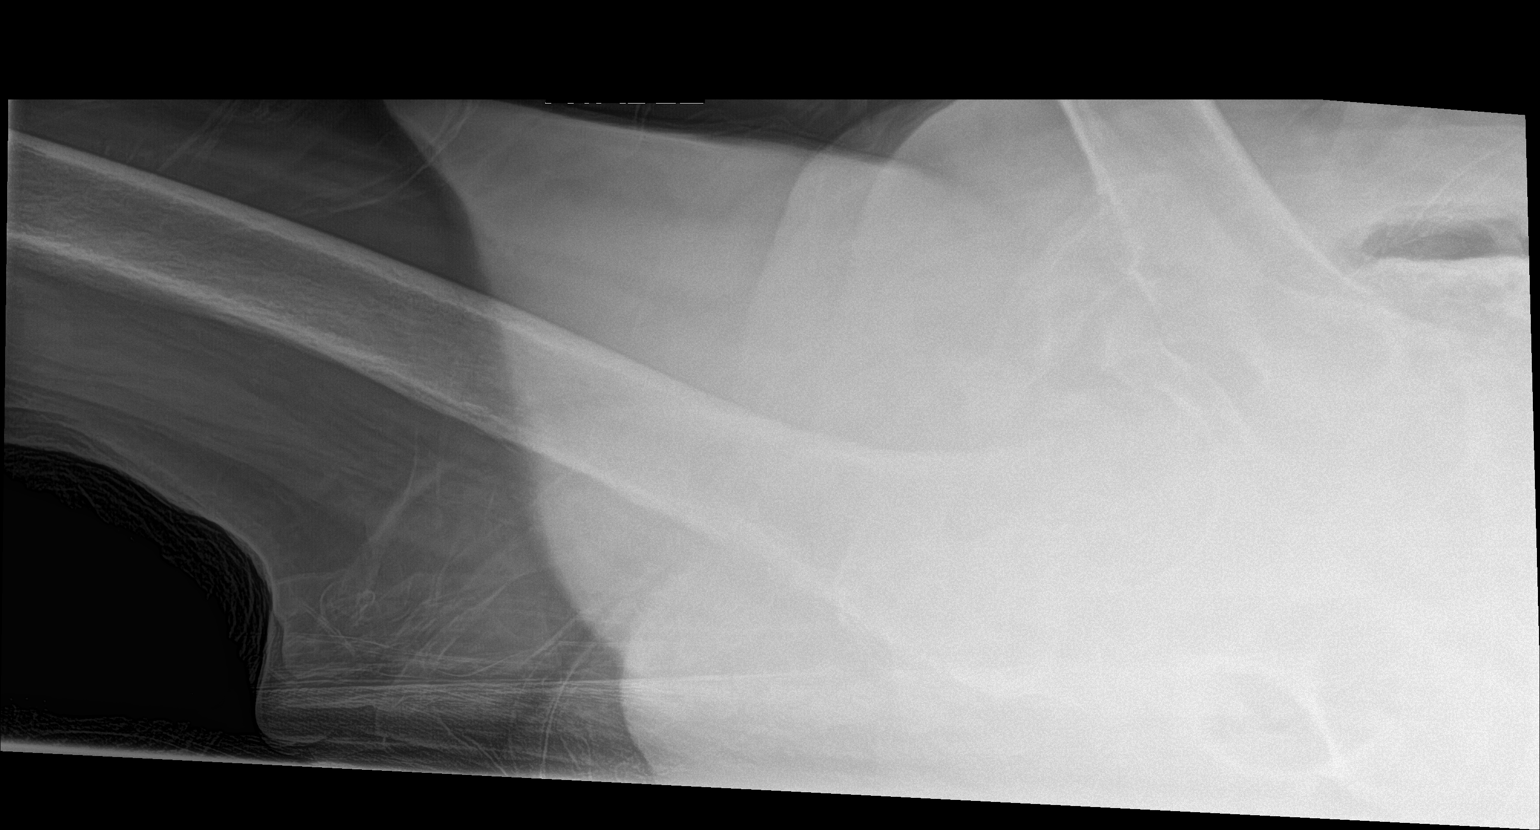

[3 of 3 positions shown; findings below may reference images not displayed]

FINDINGS: There is no evidence of hip fracture or dislocation. There is a
comminuted fracture of the right inferior and superior pubic rami.
There are degenerative changes of bilateral SI joints.
IMPRESSION: Comminuted fractures of the right superior and inferior pubic rami.

## 2020-06-05 IMAGING — DX DG WRIST COMPLETE 3+V*R*
3 series · 3 of 3 positions shown · non-contrast
Comparison: 10/13/2017

CLINICAL DATA: Pain after a fall.  Combative.

EXAM:
RIGHT WRIST - COMPLETE 3+ VIEW

[wrist ap]
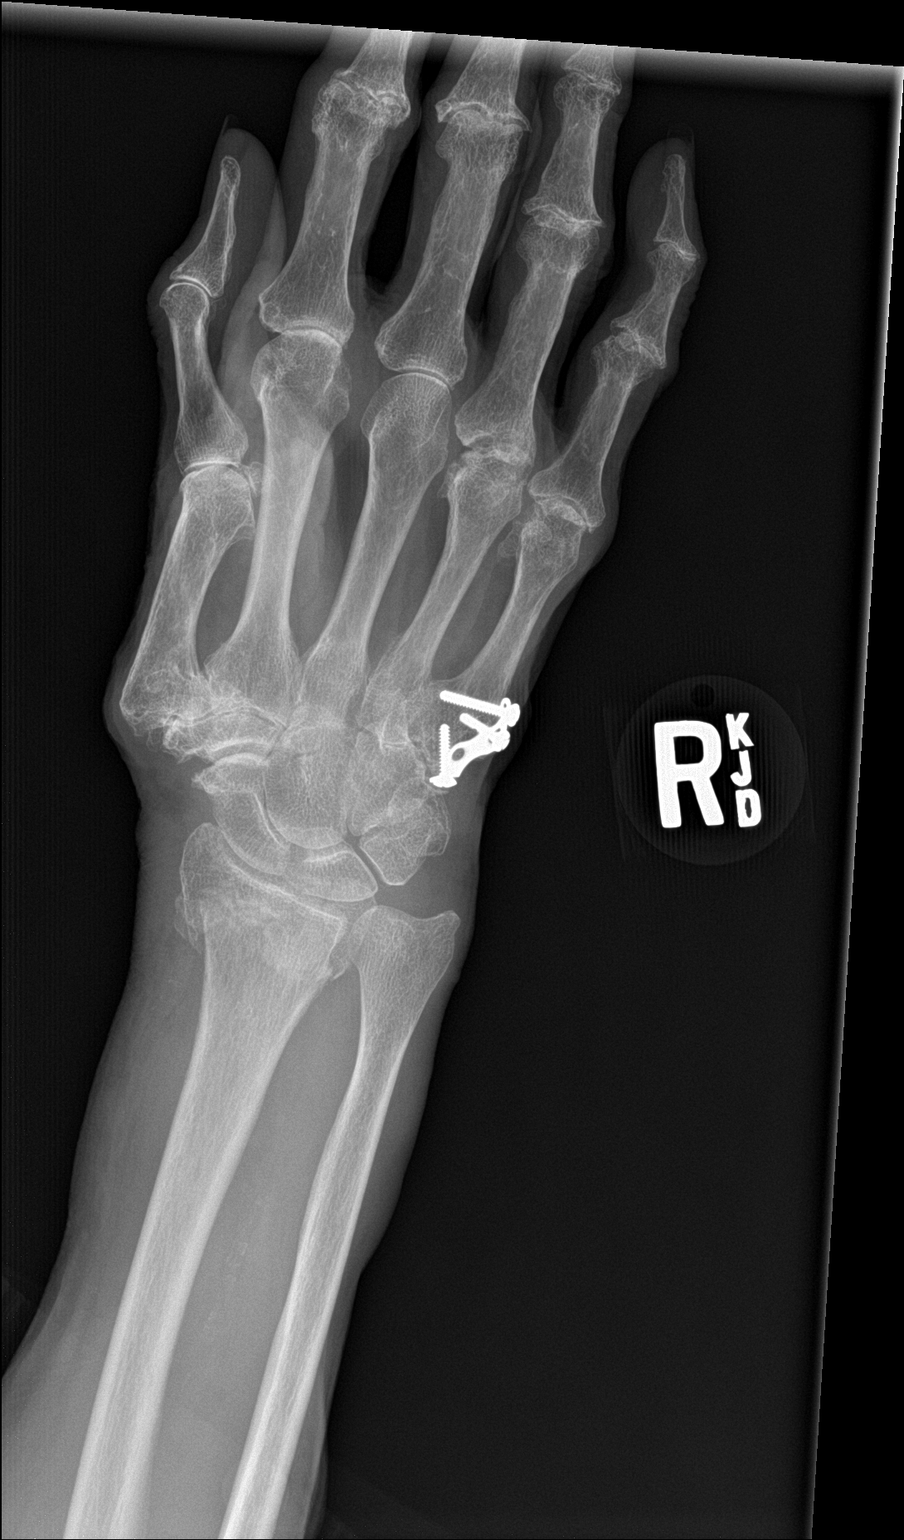

[wrist obl]
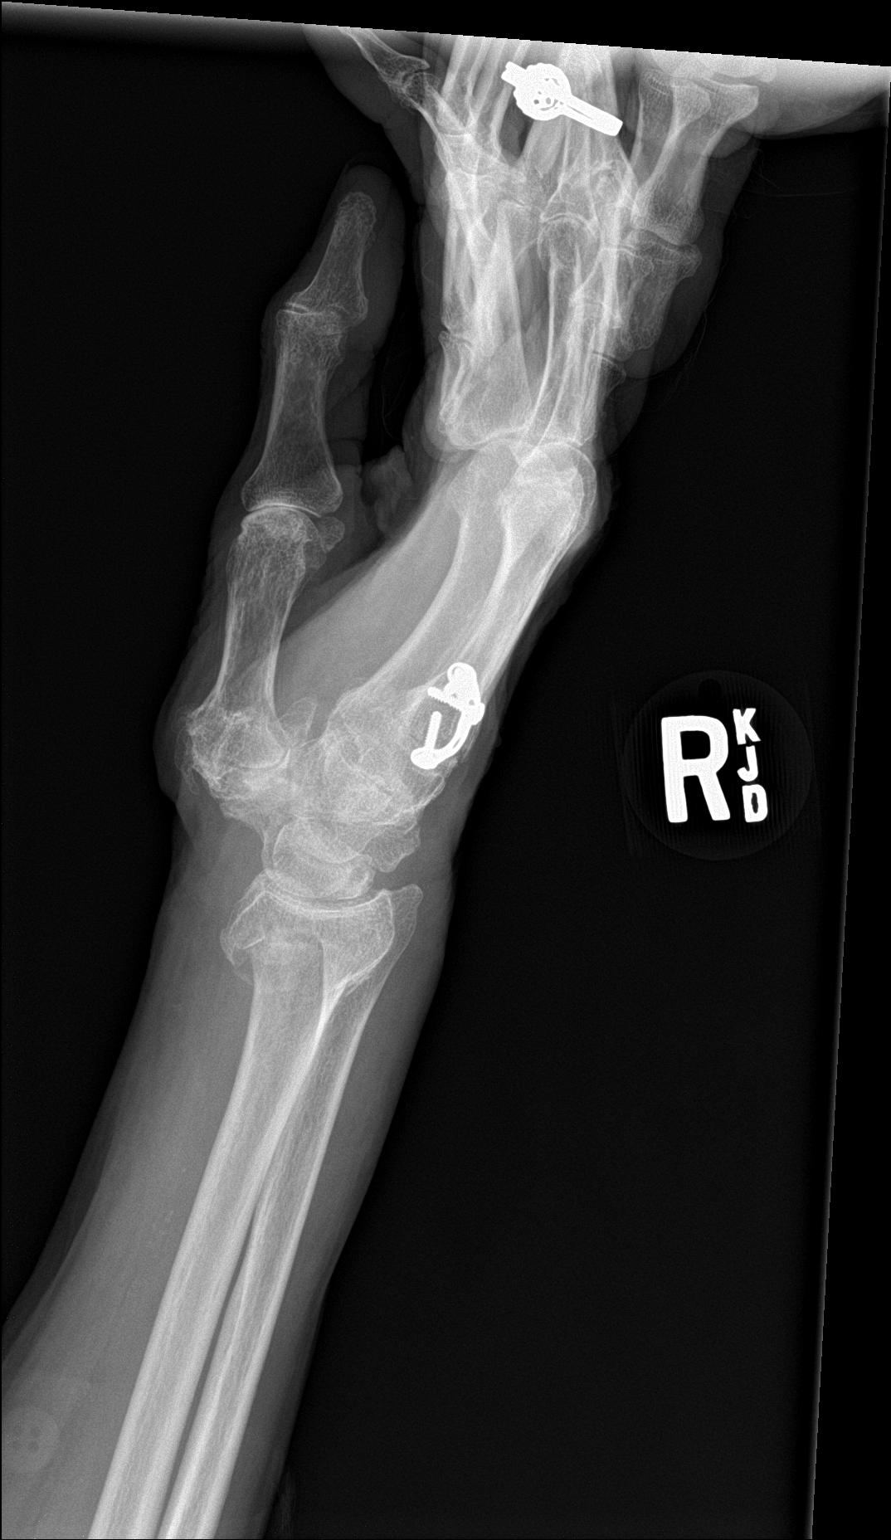

[wrist lat]
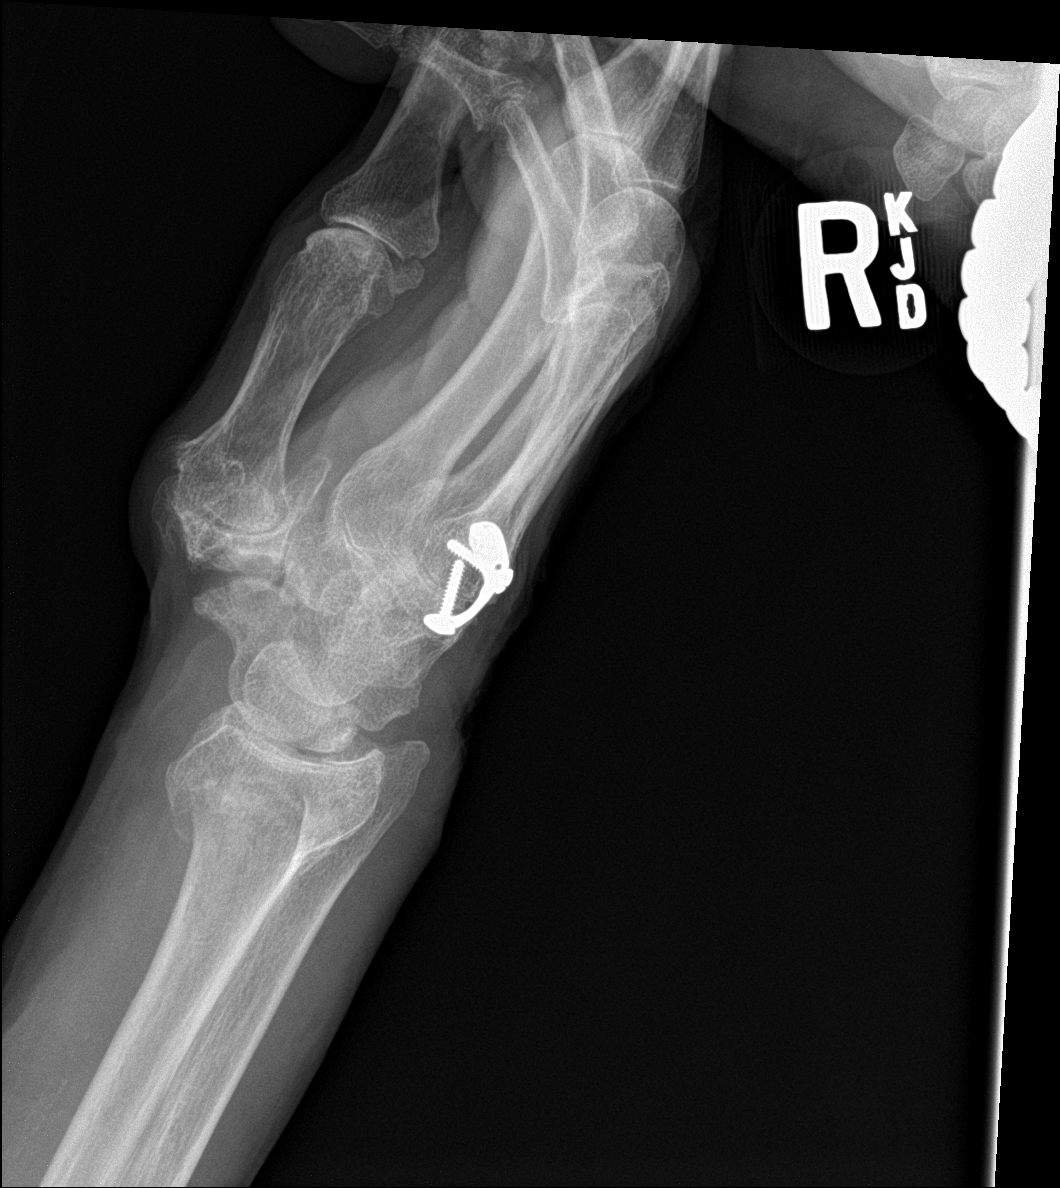

[3 of 3 positions shown; findings below may reference images not displayed]

FINDINGS: Transverse fracture of the distal right radial metaphysis with
fracture lines extending to the radiocarpal and radioulnar joints.
Impaction of the fracture fragments. No older styloid process
fracture. Diffuse degenerative changes throughout the visualized
interphalangeal, metacarpal phalangeal, first carpometacarpal, STT,
and radiocarpal joints. Postoperative changes at the fifth
carpometacarpal joint. Vascular calcifications in the soft tissues.
IMPRESSION: 1. Acute impacted fracture of the distal right radial metaphysis.
2. Diffuse degenerative changes in the hand and wrist.
